# Patient Record
Sex: Female | Born: 1945 | Race: White | Hispanic: No | Marital: Single | Smoking: Never smoker
Health system: Southern US, Community
[De-identification: ages and names within clinical notes are randomized; demographics above are authoritative.]

## PROBLEM LIST (undated history)

## (undated) DIAGNOSIS — IMO0002 Reserved for concepts with insufficient information to code with codable children: Secondary | ICD-10-CM

## (undated) DIAGNOSIS — F32A Depression, unspecified: Secondary | ICD-10-CM

## (undated) DIAGNOSIS — N816 Rectocele: Secondary | ICD-10-CM

## (undated) DIAGNOSIS — F329 Major depressive disorder, single episode, unspecified: Secondary | ICD-10-CM

## (undated) DIAGNOSIS — I10 Essential (primary) hypertension: Secondary | ICD-10-CM

## (undated) DIAGNOSIS — E079 Disorder of thyroid, unspecified: Secondary | ICD-10-CM

## (undated) DIAGNOSIS — F0390 Unspecified dementia without behavioral disturbance: Secondary | ICD-10-CM

## (undated) HISTORY — PX: BLADDER SUSPENSION: SHX72

## (undated) HISTORY — PX: TONSILLECTOMY: SUR1361

## (undated) HISTORY — PX: OTHER SURGICAL HISTORY: SHX169

## (undated) HISTORY — DX: Reserved for concepts with insufficient information to code with codable children: IMO0002

## (undated) HISTORY — DX: Rectocele: N81.6

## (undated) HISTORY — PX: HEMORRHOID SURGERY: SHX153

## (undated) HISTORY — PX: KNEE SURGERY: SHX244

---

## 1990-01-06 HISTORY — PX: OOPHORECTOMY: SHX86

## 1990-01-06 HISTORY — PX: VAGINAL HYSTERECTOMY: SUR661

## 1997-04-11 ENCOUNTER — Other Ambulatory Visit: Admission: RE | Admit: 1997-04-11 | Discharge: 1997-04-11 | Payer: Self-pay | Admitting: Obstetrics and Gynecology

## 2000-01-28 ENCOUNTER — Encounter: Payer: Self-pay | Admitting: General Surgery

## 2000-01-28 ENCOUNTER — Encounter: Admission: RE | Admit: 2000-01-28 | Discharge: 2000-01-28 | Payer: Self-pay | Admitting: General Surgery

## 2000-02-13 ENCOUNTER — Encounter: Admission: RE | Admit: 2000-02-13 | Discharge: 2000-02-13 | Payer: Self-pay | Admitting: General Surgery

## 2000-02-13 ENCOUNTER — Encounter: Payer: Self-pay | Admitting: General Surgery

## 2001-07-01 ENCOUNTER — Other Ambulatory Visit: Admission: RE | Admit: 2001-07-01 | Discharge: 2001-07-01 | Payer: Self-pay | Admitting: Obstetrics and Gynecology

## 2002-01-28 ENCOUNTER — Encounter: Payer: Self-pay | Admitting: Obstetrics and Gynecology

## 2002-02-07 ENCOUNTER — Inpatient Hospital Stay (HOSPITAL_COMMUNITY): Admission: RE | Admit: 2002-02-07 | Discharge: 2002-02-10 | Payer: Self-pay | Admitting: Obstetrics and Gynecology

## 2002-09-08 ENCOUNTER — Other Ambulatory Visit: Admission: RE | Admit: 2002-09-08 | Discharge: 2002-09-08 | Payer: Self-pay | Admitting: Obstetrics and Gynecology

## 2003-09-14 ENCOUNTER — Other Ambulatory Visit: Admission: RE | Admit: 2003-09-14 | Discharge: 2003-09-14 | Payer: Self-pay | Admitting: Obstetrics and Gynecology

## 2004-09-17 ENCOUNTER — Other Ambulatory Visit: Admission: RE | Admit: 2004-09-17 | Discharge: 2004-09-17 | Payer: Self-pay | Admitting: Obstetrics and Gynecology

## 2005-09-23 ENCOUNTER — Other Ambulatory Visit: Admission: RE | Admit: 2005-09-23 | Discharge: 2005-09-23 | Payer: Self-pay | Admitting: Obstetrics and Gynecology

## 2006-07-07 ENCOUNTER — Encounter: Admission: RE | Admit: 2006-07-07 | Discharge: 2006-07-07 | Payer: Self-pay | Admitting: Geriatric Medicine

## 2006-10-23 ENCOUNTER — Other Ambulatory Visit: Admission: RE | Admit: 2006-10-23 | Discharge: 2006-10-23 | Payer: Self-pay | Admitting: Obstetrics and Gynecology

## 2007-11-15 ENCOUNTER — Emergency Department (HOSPITAL_COMMUNITY): Admission: EM | Admit: 2007-11-15 | Discharge: 2007-11-16 | Payer: Self-pay | Admitting: Emergency Medicine

## 2007-11-16 ENCOUNTER — Other Ambulatory Visit: Admission: RE | Admit: 2007-11-16 | Discharge: 2007-11-16 | Payer: Self-pay | Admitting: Obstetrics and Gynecology

## 2007-11-16 ENCOUNTER — Encounter: Payer: Self-pay | Admitting: Obstetrics and Gynecology

## 2007-11-16 ENCOUNTER — Ambulatory Visit: Payer: Self-pay | Admitting: Obstetrics and Gynecology

## 2008-12-19 ENCOUNTER — Other Ambulatory Visit: Admission: RE | Admit: 2008-12-19 | Discharge: 2008-12-19 | Payer: Self-pay | Admitting: Obstetrics and Gynecology

## 2008-12-19 ENCOUNTER — Ambulatory Visit: Payer: Self-pay | Admitting: Obstetrics and Gynecology

## 2009-04-09 ENCOUNTER — Ambulatory Visit: Payer: Self-pay | Admitting: Cardiology

## 2009-04-18 ENCOUNTER — Emergency Department (HOSPITAL_COMMUNITY): Admission: EM | Admit: 2009-04-18 | Discharge: 2009-04-19 | Payer: Self-pay | Admitting: Emergency Medicine

## 2010-03-27 LAB — BASIC METABOLIC PANEL
BUN: 8 mg/dL (ref 6–23)
CO2: 28 mEq/L (ref 19–32)
Calcium: 9 mg/dL (ref 8.4–10.5)
Chloride: 100 mEq/L (ref 96–112)
Creatinine, Ser: 0.69 mg/dL (ref 0.4–1.2)
GFR calc Af Amer: >60 mL/min (ref >60)
GFR calc non Af Amer: >60 mL/min (ref >60)
Glucose, Bld: 97 mg/dL (ref 70–99)
Potassium: 3.4 mEq/L — ABNORMAL LOW (ref 3.5–5.1)
Sodium: 136 mEq/L (ref 135–145)

## 2010-03-27 LAB — DIFFERENTIAL
Eosinophils Relative: 2 % (ref 0–5)
Lymphocytes Relative: 16 % (ref 12–46)
Lymphs Abs: 1.1 10*3/uL (ref 0.7–4.0)
Monocytes Relative: 7 % (ref 3–12)

## 2010-03-27 LAB — CK TOTAL AND CKMB (NOT AT ARMC)
CK, MB: 1 ng/mL (ref 0.3–4.0)
Relative Index: INVALID (ref 0.0–2.5)

## 2010-03-27 LAB — TROPONIN I: Troponin I: 0.01 ng/mL (ref 0.00–0.06)

## 2010-03-27 LAB — APTT: aPTT: 27 seconds (ref 24–37)

## 2010-03-27 LAB — CBC
HCT: 38.4 % (ref 36.0–46.0)
MCV: 92.3 fL (ref 78.0–100.0)
Platelets: 190 10*3/uL (ref 150–400)
RBC: 4.16 MIL/uL (ref 3.87–5.11)
WBC: 6.8 10*3/uL (ref 4.0–10.5)

## 2010-03-27 LAB — POCT CARDIAC MARKERS
CKMB, poc: <1 ng/mL — ABNORMAL LOW (ref 1.0–8.0)
Myoglobin, poc: 63.7 ng/mL (ref 12–200)
Troponin i, poc: <0.05 ng/mL (ref 0.00–0.09)
Troponin i, poc: <0.05 ng/mL (ref 0.00–0.09)
Troponin i, poc: <0.05 ng/mL (ref 0.00–0.09)

## 2010-03-27 LAB — D-DIMER, QUANTITATIVE: D-Dimer, Quant: <0.22 ug/mL-FEU (ref 0.00–0.48)

## 2010-03-27 LAB — PROTIME-INR: Prothrombin Time: 12.7 seconds (ref 11.6–15.2)

## 2010-05-24 NOTE — Discharge Summary (Signed)
   NAME:  Frances Frances [name], Frances Frances [name]                       ACCOUNT NO.:  [id number]   MEDICAL RECORD NO.:  [id number]                   PATIENT TYPE:  INP   LOCATION:  0456                                 FACILITY:  [facility]   PHYSICIAN:  Daniel L. Eda Paschal, M.D.           DATE OF BIRTH:  13-Feb-1945   DATE OF ADMISSION:  02/07/2002  DATE OF DISCHARGE:  02/10/2002                                 DISCHARGE SUMMARY   HISTORY OF PRESENT ILLNESS:  The patient is Frances [name] 65 year old female who was  admitted to the hospital with cystocele, rectocele, and urinary stress  incontinence, for definitive surgery.  On the day of admission, she was  taken to the operating room.  She underwent an anterior and posterior repair  by Dr. Eda Paschal, and Frances [name] sling procedure by Dr. Vonita Moss.  Postoperatively when her Foley was removed, she was unable to void.  Her  Foley was reinserted.  She was then instructed on self-cath, and by the  third postoperative day she was able to do self-cath, and was voiding well  enough to be discharged.   DISCHARGE MEDICATIONS:  1. Levaquin for antibiotic prevention.   FOLLOW UP:  She will be followed in both Dr. Enos Fling and Dr. Verl Dicker  office.   DIET ON DISCHARGE:  Regular.   ACTIVITY ON DISCHARGE:  Ambulatory.   CONDITION ON DISCHARGE:  Improved.   DISCHARGE DIAGNOSES:  1. Cystocele.  2. Rectocele.  3. Urinary stress incontinence.  4. Postoperative urinary retention.   OPERATIONS:  1. Anterior and posterior repair.  2. Spark procedure.                                               Daniel L. Eda Paschal, M.D.    [name]  D:  03/01/2002  T:  03/01/2002  Job:  [id number]

## 2010-05-24 NOTE — H&P (Signed)
NAME:  Frances Frances [name], Frances Frances [name]                       ACCOUNT NO.:  [id number]   MEDICAL RECORD NO.:  [id number]                   PATIENT TYPE:  INP   LOCATION:  NA                                   FACILITY:  [facility]   PHYSICIAN:  Daniel L. Eda Paschal, M.D.           DATE OF BIRTH:  [birth year]   DATE OF ADMISSION:  DATE OF DISCHARGE:                                HISTORY & PHYSICAL   CHIEF COMPLAINT:  Symptomatic cystocele and rectocele.   HISTORY OF PRESENT ILLNESS:  The patient is Frances [name] 65 year old nulligravida who  came to see me in December with Frances [name] history of having felt something fall out  of her vagina.  She now has Frances [name] persistent bulge which is very annoying to  her.  She is also finding that as stool gets to her rectum she has to use  perineal pressure in order to remove it.  She is status post an LAVH/BSO  that was done previously because of endometriosis.  On examination she has Frances [name]  significant cystocele and small rectocele to account for the above symptoms.  She was sent to Dr. Larey Dresser by me for evaluation because she also has  Frances [name] history of urinary stress incontinence.  After consultation between the  two of [place], we have elected to proceed with an anterior and posterior repair  to get rid of her cystocele/rectocele, and she will also undergo Frances [name] sling  procedure at the same setting because of the incontinence.   PAST MEDICAL HISTORY:  Previous surgery includes LAVH/BSO for endometriosis,  knee surgery, hemorrhoidectomy.  The patient is Frances [name] type 2 diabetic.  She is  also hypertensive and hypothyroid.   PRESENT MEDICATIONS:  1. Lipitor 10 mg daily.  2. Premarin 0.625 mg daily.  3. Monopril 20/12.5.  4. Glucophage XR 500 mg daily.  5. Synthroid.   ALLERGIES:  DEMEROL, SHELLFISH, and FLAGYL.   SOCIAL HISTORY:  Completely noncontributory.   FAMILY HISTORY:  Sister is diabetic.  Family history of hypertension.   REVIEW OF SYSTEMS:  HEENT:  Negative.  CARDIAC:  Reveals  hypertension.  RESPIRATORY:  Negative.  GI:  Reveals GERD and irritable bowel syndrome.  MUSCULOSKELETAL:  Negative.  GU:  See above.  NEUROLOGIC/PSYCHIATRIC:  Negative.  ENDOCRINE:  Reveals hypothyroidism.   PHYSICAL EXAMINATION:  GENERAL:  The patient is Frances [name] well-developed and well-  nourished female in no acute distress.  VITAL SIGNS:  Blood pressure 130/76, pulse 80 and regular, respirations 16  and nonlabored.  She is afebrile.  HEENT:  Within normal limits.  NECK:  Supple, trachea in the midline.  Thyroid is not enlarged.  LUNGS:  Clear to P&Frances [name].  HEART:  No thrills, heaves, or murmurs.  BREASTS:  No masses.  ABDOMEN:  Soft without guarding, rebound, or masses.  PELVIC:  External is normal.  BUS is normal.  Bladder shows Frances [name] third degree  cystocele.  Vagina is normal without enterocele.  There  is no real obvious  rectocele.  However, the patient does have rectocele-type symptoms.  Bimanual is negative and rectal is negative.   ADMISSION IMPRESSION:  Cystocele/rectocele, urinary incontinence.   PLAN:  See above.                                               Daniel L. Eda Paschal, M.D.    [name]  D:  02/04/2002  T:  02/04/2002  Job:  [id number]

## 2010-05-24 NOTE — Op Note (Signed)
NAME:  Frances [name], Frances [name] A                       ACCOUNT NO.:  [id number]   MEDICAL RECORD NO.:  [id number]                   PATIENT TYPE:  INP   LOCATION:  0456                                 FACILITY:  [facility]   PHYSICIAN:  Daniel L. Eda Paschal, M.D.           DATE OF BIRTH:  [birth year]   DATE OF PROCEDURE:  02/07/2002  DATE OF DISCHARGE:                                 OPERATIVE REPORT   PREOPERATIVE DIAGNOSES:  Cystocele, rectocele.   POSTOPERATIVE DIAGNOSES:  Cystocele, rectocele.   OPERATION:  Anterior and posterior repair.   SURGEON:  Daniel L. Eda Paschal, M.D.   FIRST ASSISTANT:  Ivor Costa. Farrel Gobble, M.D.   ANESTHESIA:  General endotracheal.   FINDINGS:  At the beginning of the procedure, the patient had a third degree  cystocele with complete loss of the urethrovesical angle. The patient had a  1 1/2 degree rectocele, the vault was well supported, there was no  enterocele present.   DESCRIPTION OF PROCEDURE:  After adequate general endotracheal anesthesia,  the patient was placed in the dorsal lithotomy position, prepped and draped  in the usual sterile fashion. Allis clamps were placed in the angles of the  top of the vagina at the top of the cuff and an inverted T incision was made  to the top of the urethra. The perivesical fascia was sharply and bluntly  dissected free so that the cystocele was completely dissected free from the  vaginal mucosa. At this point, Dr. Larey Dresser scrubbed in and did a  sling procedure which is dictated in a separate operative note. After he  finished the sling, I scrubbed back in and the cystocele which had already  been dissected free was reduced. The patient had excellent vesical fascia  and this was brought together in the midline with interrupted's of 2-0  Vicryl to completely eliminate the cystocele. Approximately 10 such sutures  were utilized. The redundant vaginal mucosa was then trimmed away. Following  this, the anterior  vaginal mucosa was then closed with a 2-0 Vicryl. The  vesical fascia was also picked up with the second layer closing the mucosa  in order to eliminate any dead space. Once this was accomplished, attention  was next turned to the rectocele. The patient had an excellent perineum and  only needed the rectocele reduced. Therefore the Allis clamps were placed at  the mucocutaneous junction. An incision was made across and then carried up  to the top of the vaginal cuff. The perirectal fascia was sharply dissected  free and the rectocele was identified. The surgeon put one finger in the  rectum to help with the dissection and also to prevent any injury to the  rectum which did not occur. Once this had been done, the redundant vaginal  mucosa was trimmed away and the rectocele was reduced with interrupted's of  2-0 Vicryl picking up perirectal fascia. The vaginal mucosa was then closed  with a  running 2-0 Vicryl also picking up the perirectal fascia underneath  in order to eliminate dead space. At the termination of the procedure, the  patient had excellent anterior  and posterior walls of the vagina, the Foley catheter which had been  inserted by Dr. Vonita Moss was draining clear urine. The vagina was packed  with 1 inch gauze. The patient tolerated the procedure well and left the  operating room in satisfactory condition. Estimated blood loss for the  entire procedure was 250 cc.                                               Daniel L. Eda Paschal, M.D.    [name]  D:  02/07/2002  T:  02/07/2002  Job:  [id number]

## 2010-05-24 NOTE — Op Note (Signed)
NAME:  Frances [name], Frances [name]                       ACCOUNT NO.:  [id number]   MEDICAL RECORD NO.:  [id number]                   PATIENT TYPE:  INP   LOCATION:  X007                                 FACILITY:  [facility]   PHYSICIAN:  Maretta Bees. Vonita Moss, M.D.             DATE OF BIRTH:  [birth year]   DATE OF PROCEDURE:  02/07/2002  DATE OF DISCHARGE:                                 OPERATIVE REPORT   PREOPERATIVE DIAGNOSIS:  Stress urinary incontinence and cystocele.   POSTOPERATIVE DIAGNOSIS:  Stress urinary incontinence and cystocele.   PROCEDURE:  SPARC sling system insertion.   SURGEON:  Maretta Bees. Vonita Moss, M.D.   ANESTHESIA:  General.   INDICATIONS:  This 65 year old lady has had significant stress incontinence.  She has had a previous hysterectomy.  She has a large cystocele that needs  repair by Dr. Eda Paschal.  She was counseled about the risks of infection,  erosion, rejection, bladder injury, or urinary retention.   DESCRIPTION OF PROCEDURE:  After Dr. Eda Paschal made his initial vaginal  incision, he scrubbed out and I scrubbed in, and a Foley catheter was placed  int he bladder and drained 600 mL of urine.  I was able to then bluntly  dissect up toward each side of the urethra to the endopelvic fascia.  Small  incisions were made with a blade on each side of the midline suprapubically,  and a SPARC needle was placed through this stab wound and brought down on  top of the symphysis pubis and marched back down the back side of the  symphysis pubis lateral to the urethra on each side.  The needle then  perforated the endopelvic fascia on each side and was brought out through a  vaginal incision.  At this point I cystoscoped her and there was no evidence  of the needle injuring the bladder or being visible, and there were no folds  in the bladder to obscure visualization.  I then snapped on the sling  material to each needle, and the sling was pulled up into the suprapubic  stab  wounds bilaterally.  The plastic coverings were removed from the  fascial sling, which was then positioned in midurethra with a hemostat  between the sling material and the urethra so it would not be too tight and  yet tight enough to control stress incontinence.  The sling material was cut  off just below skin level in each of our suprapubic wounds, and each  suprapubic wound was irrigated with antibiotic solution.  I should add that  after I had pulled up the sling material and before I put it in position, I  did cystoscope her again and saw no sling material in the bladder.  At this  point the sling was in midurethra with just space enough for a  hemostat to slip between it and the urethra, and I irrigated the wound again  with antibiotic solution and scrubbed  out while Dr. Eda Paschal completed his  cystocele repair.  The Foley catheter was left indwelling and will be  removed in the morning.                                               Maretta Bees. Vonita Moss, M.D.    LJP/MEDQ  D:  02/07/2002  T:  02/07/2002  Job:  [id number]   cc:   Reuel Boom L. Eda Paschal, M.D.  [address], [address]  [place]  [place] [address]  Fax: [phone]

## 2010-07-26 ENCOUNTER — Other Ambulatory Visit: Payer: Self-pay | Admitting: Geriatric Medicine

## 2010-07-26 DIAGNOSIS — R413 Other amnesia: Secondary | ICD-10-CM

## 2010-07-31 ENCOUNTER — Ambulatory Visit
Admission: RE | Admit: 2010-07-31 | Discharge: 2010-07-31 | Disposition: A | Discharge status: Home / Self Care | Payer: BC Managed Care – PPO | Source: Ambulatory Visit | Attending: Geriatric Medicine | Admitting: Geriatric Medicine

## 2010-07-31 DIAGNOSIS — R413 Other amnesia: Secondary | ICD-10-CM

## 2010-07-31 MED ORDER — GADOBENATE DIMEGLUMINE 529 MG/ML IV SOLN
13.0000 mL | Freq: Once | INTRAVENOUS | Status: AC | PRN
  Administered 2010-07-31: 13 mL via INTRAVENOUS

## 2010-10-14 DIAGNOSIS — R413 Other amnesia: Secondary | ICD-10-CM

## 2010-10-14 DIAGNOSIS — F329 Major depressive disorder, single episode, unspecified: Secondary | ICD-10-CM

## 2011-03-18 DIAGNOSIS — F0280 Dementia in other diseases classified elsewhere without behavioral disturbance: Secondary | ICD-10-CM | POA: Diagnosis not present

## 2011-03-18 DIAGNOSIS — R413 Other amnesia: Secondary | ICD-10-CM | POA: Diagnosis not present

## 2011-03-18 DIAGNOSIS — G309 Alzheimer's disease, unspecified: Secondary | ICD-10-CM | POA: Diagnosis not present

## 2011-03-24 ENCOUNTER — Encounter: Payer: Self-pay | Admitting: Obstetrics and Gynecology

## 2011-03-28 DIAGNOSIS — F0280 Dementia in other diseases classified elsewhere without behavioral disturbance: Secondary | ICD-10-CM | POA: Diagnosis not present

## 2011-03-28 DIAGNOSIS — G309 Alzheimer's disease, unspecified: Secondary | ICD-10-CM | POA: Diagnosis not present

## 2011-03-28 DIAGNOSIS — E119 Type 2 diabetes mellitus without complications: Secondary | ICD-10-CM | POA: Diagnosis not present

## 2011-03-28 DIAGNOSIS — I10 Essential (primary) hypertension: Secondary | ICD-10-CM | POA: Diagnosis not present

## 2011-04-07 ENCOUNTER — Emergency Department (HOSPITAL_COMMUNITY): Payer: Medicare Other

## 2011-04-07 ENCOUNTER — Emergency Department (HOSPITAL_COMMUNITY)
Admission: EM | Admit: 2011-04-07 | Discharge: 2011-04-07 | Disposition: A | Discharge status: Home / Self Care | Payer: Medicare Other | Attending: Emergency Medicine | Admitting: Emergency Medicine

## 2011-04-07 ENCOUNTER — Encounter (HOSPITAL_COMMUNITY): Payer: Self-pay | Admitting: Emergency Medicine

## 2011-04-07 DIAGNOSIS — F329 Major depressive disorder, single episode, unspecified: Secondary | ICD-10-CM | POA: Insufficient documentation

## 2011-04-07 DIAGNOSIS — M79609 Pain in unspecified limb: Secondary | ICD-10-CM | POA: Insufficient documentation

## 2011-04-07 DIAGNOSIS — F0390 Unspecified dementia without behavioral disturbance: Secondary | ICD-10-CM | POA: Diagnosis not present

## 2011-04-07 DIAGNOSIS — I10 Essential (primary) hypertension: Secondary | ICD-10-CM | POA: Diagnosis not present

## 2011-04-07 DIAGNOSIS — M79672 Pain in left foot: Secondary | ICD-10-CM

## 2011-04-07 DIAGNOSIS — F3289 Other specified depressive episodes: Secondary | ICD-10-CM | POA: Insufficient documentation

## 2011-04-07 DIAGNOSIS — M25579 Pain in unspecified ankle and joints of unspecified foot: Secondary | ICD-10-CM | POA: Insufficient documentation

## 2011-04-07 DIAGNOSIS — Z7982 Long term (current) use of aspirin: Secondary | ICD-10-CM | POA: Diagnosis not present

## 2011-04-07 DIAGNOSIS — Z79899 Other long term (current) drug therapy: Secondary | ICD-10-CM | POA: Diagnosis not present

## 2011-04-07 DIAGNOSIS — E119 Type 2 diabetes mellitus without complications: Secondary | ICD-10-CM | POA: Diagnosis not present

## 2011-04-07 DIAGNOSIS — E079 Disorder of thyroid, unspecified: Secondary | ICD-10-CM | POA: Insufficient documentation

## 2011-04-07 DIAGNOSIS — M2010 Hallux valgus (acquired), unspecified foot: Secondary | ICD-10-CM | POA: Diagnosis not present

## 2011-04-07 DIAGNOSIS — S8990XA Unspecified injury of unspecified lower leg, initial encounter: Secondary | ICD-10-CM | POA: Insufficient documentation

## 2011-04-07 DIAGNOSIS — M7989 Other specified soft tissue disorders: Secondary | ICD-10-CM | POA: Diagnosis not present

## 2011-04-07 DIAGNOSIS — M25879 Other specified joint disorders, unspecified ankle and foot: Secondary | ICD-10-CM | POA: Diagnosis not present

## 2011-04-07 DIAGNOSIS — X500XXA Overexertion from strenuous movement or load, initial encounter: Secondary | ICD-10-CM | POA: Insufficient documentation

## 2011-04-07 HISTORY — DX: Depression, unspecified: F32.A

## 2011-04-07 HISTORY — DX: Disorder of thyroid, unspecified: E07.9

## 2011-04-07 HISTORY — DX: Unspecified dementia, unspecified severity, without behavioral disturbance, psychotic disturbance, mood disturbance, and anxiety: F03.90

## 2011-04-07 HISTORY — DX: Essential (primary) hypertension: I10

## 2011-04-07 HISTORY — DX: Major depressive disorder, single episode, unspecified: F32.9

## 2011-04-07 MED ORDER — HYDROCODONE-ACETAMINOPHEN 5-325 MG PO TABS: 1.0000 | ORAL_TABLET | ORAL | Status: AC | PRN

## 2011-04-07 NOTE — ED Provider Notes (Signed)
History     CSN: [id number]  Arrival date & time 04/07/11  1191   First MD Initiated Contact with Patient 04/07/11 [phone]      Chief Complaint  Patient presents with  . Toe Injury    l/great toe- (first toe) swollenn due to tripping on stairs two days ago. 2nd toe slightly swollen    (Consider location/radiation/quality/duration/timing/severity/associated sxs/prior treatment) HPI History from patient. 66 year old female past medical history of hypertension, diabetes presents with left great toe pain. She states that on Saturday, she was ascending some stairs when she tripped, twisting her left foot. Did not hit her head, no LOC. She has had worsening pain to the left great toe since that time. Pain is throbbing in nature and worsens with wt bearing. Has noticed increased swelling to the area. Has tried ice which was minimally helpful. No other treatments prior to arrival.  Past Medical History  Diagnosis Date  . Hypertension   . Diabetes mellitus   . Thyroid disease   . Dementia   . Depressed     Past Surgical History  Procedure Date  . Abdominal hysterectomy   . Tonsillectomy     Family History  Problem Relation Age of Onset  . Hypertension Mother   . Stroke Mother   . Heart failure Mother     History  Substance Use Topics  . Smoking status: Never Smoker   . Smokeless tobacco: Not on file  . Alcohol Use: No    OB History    Grav Para Term Preterm Abortions TAB SAB Ect Mult Living                  Review of Systems  Constitutional: Negative.   Musculoskeletal: Positive for arthralgias and gait problem.  Skin: Negative.   Neurological: Negative for weakness and numbness.    Allergies  Review of patient's allergies indicates no known allergies.  Home Medications   Current Outpatient Rx  Name Route Sig Dispense Refill  . ASPIRIN 81 MG PO TABS Oral Take 81 mg by mouth daily.    . ATORVASTATIN CALCIUM 20 MG PO TABS Oral Take 20 mg by mouth daily.    Marland Kitchen  CETIRIZINE HCL 10 MG PO TABS Oral Take 10 mg by mouth daily.    . DONEPEZIL HCL 10 MG PO TABS Oral Take 23 mg by mouth at bedtime as needed.    Marland Kitchen ESCITALOPRAM OXALATE 10 MG PO TABS Oral Take 10 mg by mouth daily.    . FOSINOPRIL SODIUM 40 MG PO TABS Oral Take 40 mg by mouth daily.    . FUROSEMIDE 20 MG PO TABS Oral Take 20 mg by mouth 2 (two) times daily.    Marland Kitchen LEVOTHYROXINE SODIUM 75 MCG PO TABS Oral Take 75 mcg by mouth daily.    Marland Kitchen METFORMIN HCL 500 MG PO TABS Oral Take 500 mg by mouth 1 day or 1 dose.      BP 130/68  Pulse 64  Temp 97.4 F (36.3 C)  SpO2 100%  Physical Exam  Nursing note and vitals reviewed. Constitutional: She appears well-developed and well-nourished. No distress.  HENT:  Head: Normocephalic.  Cardiovascular: Normal rate.   Pulmonary/Chest: Effort normal.  Musculoskeletal:       L foot: No erythema, edema, crepitus noted. Tender to palpation at MTP of great toe. Slight hallux valgus noted. Full range of motion of toes, although dorsiflexion causes pain. Foot is neurovascularly intact with sensory intact to light touch. Capillary refill  less than 3 seconds. DP/PT pulses intact.   Neurological: She is alert.  Skin: Skin is warm and dry. She is not diaphoretic.  Psychiatric: She has a normal mood and affect.    ED Course  Procedures (including critical care time)  Labs Reviewed - No data to display Dg Foot Complete Left  04/07/2011  *RADIOLOGY REPORT*  Clinical Data: Tripped stepping off a curb twisting foot, fall, great toe pain  LEFT FOOT - COMPLETE 3+ VIEW  Comparison: None  Findings: Osseous demineralization. Mild degenerative changes first MTP joint with joint space narrowing and spur formation. Mild hallux valgus. Soft tissue swelling medial aspect of great toe and first MTP joint. Remaining joint spaces preserved. No definite acute fracture, dislocation or bone destruction.  IMPRESSION: Mild hallux valgus and degenerative changes first MTP joint. No acute  osseous abnormalities.  Original Report Authenticated By: Lollie Marrow, M.D.     1. Left foot pain       MDM  This 66 year old female presents with pain to her left foot. X-rays, which I personally reviewed and reviewed with the patient are negative for acute fracture or bony abnormality. They do, however, show degenerative changes around the first MTP and mild hallux valgus. Will place in post op shoe and on crutches, NWB. Instructed to f/u with Dr. Pete Glatter later this week. Return precautions discussed.        Grant Fontana, [place] 04/07/11 1250

## 2011-04-07 NOTE — ED Provider Notes (Signed)
Medical screening examination/treatment/procedure(s) were conducted as a shared visit with non-physician practitioner(s) and myself.  I personally evaluated the patient during the encounter Pt with injury to the foot without other injury.  Plain films wnl.  Pt placed in post-op shoe and given f/u.  Gwyneth Sprout, MD 04/07/11 2147

## 2011-04-07 NOTE — Discharge Instructions (Signed)
Your xray did not show signs of a broken bone. Please plan to follow up with Dr. Pete Glatter in 2 days for a recheck. In the meantime, stay non weight bearing on that foot for at least the next 2 days. Use the post op shoe for comfort. Continue to ice and prop up the foot on pillows. You may use Tylenol as needed for pain. Return to the ER for worsening pain, numbness, weakness, or other worrisome symptoms.

## 2011-05-13 DIAGNOSIS — E7800 Pure hypercholesterolemia, unspecified: Secondary | ICD-10-CM | POA: Diagnosis not present

## 2011-05-13 DIAGNOSIS — Z79899 Other long term (current) drug therapy: Secondary | ICD-10-CM | POA: Diagnosis not present

## 2011-06-11 ENCOUNTER — Encounter: Payer: Self-pay | Admitting: Gynecology

## 2011-06-11 DIAGNOSIS — R32 Unspecified urinary incontinence: Secondary | ICD-10-CM | POA: Insufficient documentation

## 2011-06-11 DIAGNOSIS — N816 Rectocele: Secondary | ICD-10-CM | POA: Insufficient documentation

## 2011-06-11 DIAGNOSIS — E119 Type 2 diabetes mellitus without complications: Secondary | ICD-10-CM | POA: Insufficient documentation

## 2011-06-11 DIAGNOSIS — F0390 Unspecified dementia without behavioral disturbance: Secondary | ICD-10-CM | POA: Insufficient documentation

## 2011-06-11 DIAGNOSIS — N809 Endometriosis, unspecified: Secondary | ICD-10-CM | POA: Insufficient documentation

## 2011-06-11 DIAGNOSIS — IMO0002 Reserved for concepts with insufficient information to code with codable children: Secondary | ICD-10-CM | POA: Insufficient documentation

## 2011-06-11 DIAGNOSIS — E039 Hypothyroidism, unspecified: Secondary | ICD-10-CM | POA: Insufficient documentation

## 2011-06-23 ENCOUNTER — Ambulatory Visit (INDEPENDENT_AMBULATORY_CARE_PROVIDER_SITE_OTHER): Payer: Medicare Other | Admitting: Obstetrics and Gynecology

## 2011-06-23 ENCOUNTER — Encounter: Payer: Self-pay | Admitting: Obstetrics and Gynecology

## 2011-06-23 VITALS — BP 136/78 | Ht 67.0 in | Wt 158.0 lb

## 2011-06-23 DIAGNOSIS — N952 Postmenopausal atrophic vaginitis: Secondary | ICD-10-CM

## 2011-06-23 DIAGNOSIS — N816 Rectocele: Secondary | ICD-10-CM | POA: Diagnosis not present

## 2011-06-23 DIAGNOSIS — Z780 Asymptomatic menopausal state: Secondary | ICD-10-CM | POA: Diagnosis not present

## 2011-06-23 NOTE — Progress Notes (Signed)
Patient came back to see me today for further followup. We have had her on estrogen replacement therapy for a long period of time. Last year we were able to taper her and so far her symptoms are mild enough to not require reinitiation of treatment. She does have vaginal dryness but is not sexually active and is unaware of it. Her urinary stress incontinence is much relieved since her sling procedure. She does have a small rectocele but has no trouble getting stool out. She is having no dysuria, urinary urgency, or hematuria. She is up-to-date on mammograms. She a normal bone density through our office. She is having no vaginal bleeding. She is having no pelvic pain.  HEENT: Within normal limits. Blanca present Neck: No masses. Supraclavicular lymph nodes: Not enlarged. Breasts: Examined in both sitting and lying position. Symmetrical without skin changes or masses. Abdomen: Soft no masses guarding or rebound. No hernias. Pelvic: External within normal limits. BUS within normal limits. Vaginal examination shows poor estrogen effect, first-degree rectocele.. Cervix and uterus absent. Adnexa within normal limits. Rectovaginal confirmatory. Extremities within normal limits.  Assessment: #1. Menopausal symptoms significantly improved #2. Small rectocele-asymptomatic #3. Vaginal atrophy.  Plan: Discussed all of the above in detail with the patient. For the moment no treatment necessary. A followup mammogram in one year.

## 2011-06-30 DIAGNOSIS — E119 Type 2 diabetes mellitus without complications: Secondary | ICD-10-CM | POA: Diagnosis not present

## 2011-06-30 DIAGNOSIS — I10 Essential (primary) hypertension: Secondary | ICD-10-CM | POA: Diagnosis not present

## 2011-06-30 DIAGNOSIS — Z79899 Other long term (current) drug therapy: Secondary | ICD-10-CM | POA: Diagnosis not present

## 2011-07-21 DIAGNOSIS — F0280 Dementia in other diseases classified elsewhere without behavioral disturbance: Secondary | ICD-10-CM | POA: Diagnosis not present

## 2011-07-21 DIAGNOSIS — F329 Major depressive disorder, single episode, unspecified: Secondary | ICD-10-CM | POA: Diagnosis not present

## 2011-10-21 DIAGNOSIS — L820 Inflamed seborrheic keratosis: Secondary | ICD-10-CM | POA: Diagnosis not present

## 2011-10-21 DIAGNOSIS — L570 Actinic keratosis: Secondary | ICD-10-CM | POA: Diagnosis not present

## 2011-10-21 DIAGNOSIS — Z85828 Personal history of other malignant neoplasm of skin: Secondary | ICD-10-CM | POA: Diagnosis not present

## 2011-10-21 DIAGNOSIS — D239 Other benign neoplasm of skin, unspecified: Secondary | ICD-10-CM | POA: Diagnosis not present

## 2011-10-28 DIAGNOSIS — E119 Type 2 diabetes mellitus without complications: Secondary | ICD-10-CM | POA: Diagnosis not present

## 2011-10-28 DIAGNOSIS — F0280 Dementia in other diseases classified elsewhere without behavioral disturbance: Secondary | ICD-10-CM | POA: Diagnosis not present

## 2011-10-28 DIAGNOSIS — Z23 Encounter for immunization: Secondary | ICD-10-CM | POA: Diagnosis not present

## 2011-10-28 DIAGNOSIS — I10 Essential (primary) hypertension: Secondary | ICD-10-CM | POA: Diagnosis not present

## 2011-11-21 DIAGNOSIS — G309 Alzheimer's disease, unspecified: Secondary | ICD-10-CM | POA: Diagnosis not present

## 2011-12-16 DIAGNOSIS — G309 Alzheimer's disease, unspecified: Secondary | ICD-10-CM | POA: Diagnosis not present

## 2011-12-16 DIAGNOSIS — E119 Type 2 diabetes mellitus without complications: Secondary | ICD-10-CM | POA: Diagnosis not present

## 2011-12-16 DIAGNOSIS — I10 Essential (primary) hypertension: Secondary | ICD-10-CM | POA: Diagnosis not present

## 2011-12-16 DIAGNOSIS — Z79899 Other long term (current) drug therapy: Secondary | ICD-10-CM | POA: Diagnosis not present

## 2011-12-16 DIAGNOSIS — F0280 Dementia in other diseases classified elsewhere without behavioral disturbance: Secondary | ICD-10-CM | POA: Diagnosis not present

## 2012-01-13 DIAGNOSIS — Z85828 Personal history of other malignant neoplasm of skin: Secondary | ICD-10-CM | POA: Diagnosis not present

## 2012-01-13 DIAGNOSIS — L905 Scar conditions and fibrosis of skin: Secondary | ICD-10-CM | POA: Diagnosis not present

## 2012-01-28 ENCOUNTER — Other Ambulatory Visit: Payer: Self-pay | Admitting: Neurology

## 2012-01-28 DIAGNOSIS — R413 Other amnesia: Secondary | ICD-10-CM

## 2012-02-04 ENCOUNTER — Other Ambulatory Visit: Payer: Medicare Other

## 2012-02-10 ENCOUNTER — Ambulatory Visit
Admission: RE | Admit: 2012-02-10 | Discharge: 2012-02-10 | Disposition: A | Discharge status: Home / Self Care | Payer: No Typology Code available for payment source | Source: Ambulatory Visit | Attending: Neurology | Admitting: Neurology

## 2012-02-10 DIAGNOSIS — R413 Other amnesia: Secondary | ICD-10-CM

## 2012-02-26 ENCOUNTER — Other Ambulatory Visit (HOSPITAL_COMMUNITY): Payer: Self-pay | Admitting: Neurology

## 2012-02-26 DIAGNOSIS — R413 Other amnesia: Secondary | ICD-10-CM

## 2012-03-11 ENCOUNTER — Encounter (HOSPITAL_COMMUNITY)
Admission: RE | Admit: 2012-03-11 | Discharge: 2012-03-11 | Disposition: A | Discharge status: Home / Self Care | Payer: Self-pay | Source: Ambulatory Visit | Attending: Neurology | Admitting: Neurology

## 2012-03-11 DIAGNOSIS — R413 Other amnesia: Secondary | ICD-10-CM

## 2012-03-19 DIAGNOSIS — F0280 Dementia in other diseases classified elsewhere without behavioral disturbance: Secondary | ICD-10-CM | POA: Diagnosis not present

## 2012-03-19 DIAGNOSIS — G309 Alzheimer's disease, unspecified: Secondary | ICD-10-CM | POA: Diagnosis not present

## 2012-03-19 DIAGNOSIS — E119 Type 2 diabetes mellitus without complications: Secondary | ICD-10-CM | POA: Diagnosis not present

## 2012-04-02 ENCOUNTER — Ambulatory Visit (INDEPENDENT_AMBULATORY_CARE_PROVIDER_SITE_OTHER): Payer: Self-pay | Admitting: *Deleted

## 2012-04-02 DIAGNOSIS — F0280 Dementia in other diseases classified elsewhere without behavioral disturbance: Secondary | ICD-10-CM

## 2012-04-02 DIAGNOSIS — G309 Alzheimer's disease, unspecified: Secondary | ICD-10-CM

## 2012-04-02 NOTE — Progress Notes (Signed)
Patient was seen today for the visit 2 in the EXPEDITION 3 trial. Patient was accompanied by Rhona Wolfe as caregiver. Patient was started with several questionnaires while awaiting Dr. Sethi to perform neur and physical exam. During the questionnaire process, patient became very distraught and nervous. Stating that she did not like being asked some many questions about her condition. This concern was brought to Dr. Sethi's attention prior to his assessment. Dr. Sethi perform a physical and neuro exam on patient. Dr. Sethi discussed his concerns with patient regarding the completion of questionnaires and her emotional capabilities. Patient states that she did not want to participate in the battery of questionnaires for the trial. Dr. Sethi recommended that patient be a screen failure due to the inability to complete the questionnaires and to the decline in patient memory since last office visit.  Dr. Sethi stated that patient did not meet exclusion #31.Patient was given sample pack of Namenda by Dr. Sethi and will follow with primary neurologist in July as scheduled.  

## 2012-04-02 NOTE — Research (Signed)
Patient was seen today for the visit 2 in the EXPEDITION 3 trial. Patient was accompanied by Nicolette Bang as caregiver. Patient was started with several questionnaires while awaiting Dr. Pearlean Brownie to perform neur and physical exam. During the questionnaire process, patient became very distraught and nervous. Stating that she did not like being asked some many questions about her condition. This concern was brought to Dr. Marlis Edelson attention prior to his assessment. Dr. Pearlean Brownie perform a physical and neuro exam on patient. Dr. Pearlean Brownie discussed his concerns with patient regarding the completion of questionnaires and her emotional capabilities. Patient states that she did not want to participate in the battery of questionnaires for the trial. Dr. Pearlean Brownie recommended that patient be a screen failure due to the inability to complete the questionnaires and to the decline in patient memory since last office visit.  Dr. Pearlean Brownie stated that patient did not meet exclusion #31.Patient was given sample pack of Namenda by Dr. Pearlean Brownie and will follow with primary neurologist in July as scheduled.

## 2012-04-16 DIAGNOSIS — Z1231 Encounter for screening mammogram for malignant neoplasm of breast: Secondary | ICD-10-CM | POA: Diagnosis not present

## 2012-04-21 DIAGNOSIS — N63 Unspecified lump in unspecified breast: Secondary | ICD-10-CM | POA: Diagnosis not present

## 2012-05-10 ENCOUNTER — Other Ambulatory Visit: Payer: Self-pay

## 2012-05-10 MED ORDER — ESCITALOPRAM OXALATE 10 MG PO TABS: 10.0000 mg | ORAL_TABLET | Freq: Every day | ORAL | Status: DC

## 2012-05-10 NOTE — Telephone Encounter (Signed)
[facility] patient.  Approved refill Via [facility]

## 2012-05-10 NOTE — Telephone Encounter (Signed)
[facility] patient has not been assigned new provider.  Auth refills via [facility]

## 2012-06-22 DIAGNOSIS — E039 Hypothyroidism, unspecified: Secondary | ICD-10-CM | POA: Diagnosis not present

## 2012-06-22 DIAGNOSIS — Z1331 Encounter for screening for depression: Secondary | ICD-10-CM | POA: Diagnosis not present

## 2012-06-22 DIAGNOSIS — E782 Mixed hyperlipidemia: Secondary | ICD-10-CM | POA: Diagnosis not present

## 2012-06-22 DIAGNOSIS — E119 Type 2 diabetes mellitus without complications: Secondary | ICD-10-CM | POA: Diagnosis not present

## 2012-06-22 DIAGNOSIS — Z0000 Encounter for general adult medical examination without abnormal findings: Secondary | ICD-10-CM | POA: Diagnosis not present

## 2012-06-22 DIAGNOSIS — Z79899 Other long term (current) drug therapy: Secondary | ICD-10-CM | POA: Diagnosis not present

## 2012-07-07 DIAGNOSIS — Z79899 Other long term (current) drug therapy: Secondary | ICD-10-CM | POA: Diagnosis not present

## 2012-07-07 DIAGNOSIS — I10 Essential (primary) hypertension: Secondary | ICD-10-CM | POA: Diagnosis not present

## 2012-07-28 ENCOUNTER — Encounter: Payer: Self-pay | Admitting: Neurology

## 2012-08-02 ENCOUNTER — Telehealth: Payer: Self-pay | Admitting: Neurology

## 2012-08-05 ENCOUNTER — Encounter: Payer: Self-pay | Admitting: Neurology

## 2012-08-19 ENCOUNTER — Encounter: Payer: Self-pay | Admitting: Neurology

## 2012-08-19 ENCOUNTER — Ambulatory Visit (INDEPENDENT_AMBULATORY_CARE_PROVIDER_SITE_OTHER): Payer: Medicare Other | Admitting: Neurology

## 2012-08-19 VITALS — BP 140/76 | HR 77 | Ht 67.5 in | Wt 166.0 lb

## 2012-08-19 DIAGNOSIS — F0280 Dementia in other diseases classified elsewhere without behavioral disturbance: Secondary | ICD-10-CM

## 2012-08-19 DIAGNOSIS — G309 Alzheimer's disease, unspecified: Secondary | ICD-10-CM | POA: Diagnosis not present

## 2012-08-19 NOTE — Patient Instructions (Addendum)
I think overall you are doing fairly well but I do want to suggest a few things today:  Remember to drink plenty of fluid, eat healthy meals and do not skip any meals. Try to eat protein with a every meal and eat a healthy snack such as fruit or nuts in between meals. Try to keep a regular sleep-wake schedule and try to exercise daily, particularly in the form of walking, 20-30 minutes a day, if you can.   Engage in social activities in your community and with your family and try to keep up with current events by reading the newspaper or watching the news.   As far as your medications are concerned, I would like to suggest no changes needed in your meds.    As far as diagnostic testing: no new test needed at this time.  I would like to see you back in 6 months, sooner if we need to. Please call us with any interim questions, concerns, problems, updates or refill requests.  Please also call us for any test results so we can go over those with you on the phone. Brett Canales is my clinical assistant and will answer any of your questions and relay your messages to me and also relay most of my messages to you.  Our phone number is [phone]. We also have an after hours call service for urgent matters and there is a physician on-call for urgent questions. For any emergencies you know to call 911 or go to the nearest emergency room.

## 2012-08-19 NOTE — Progress Notes (Signed)
Subjective:    Patient ID: Frances [name] is a 67 y.o. female.  HPI  Interim history:   Frances [name] is a very pleasant 67 year old right-handed woman who presents for followup consultation of her dementia. She is accompanied by her POA, Estrellita Ludwig, today. This is her first visit after Dr. Imagene Gurney retirement and she last saw Dr. Fayrene Fearing love on 03/19/2012, at which time he felt that she should no longer drive. Her MMSE had worsened. She had a previous beta-amyloid PET scan which was indicative for Alzheimer's disease. She has an underlying medical history of hypertension, diabetes, hyperlipidemia, depression and anxiety. She is status post knee surgery, tonsillectomy and hysterectomy. She is currently on Aricept 23 mg, Lipitor, Lasix, metformin, fosinopril, Synthroid, baby aspirin, generic Celexa 10 mg. She was started on Vitamin E. She had a total of 4 sisters and one brother, one sister died. She lives in [place] with a friend. She is single and has no children of her own. She did a driving test in July 2014, and her assessment was: no night driving, no interstate driving, no more than 10 m radius, no more than 45 m zones. Her PCP is in agreement with that.   I reviewed Dr. Imagene Gurney prior notes and the patient's records and below is a summary of that review:  67 year old right-handed woman who has an at least 4 year history of memory loss. She had workup including B12 level, ESR, TSH which were normal in July 2012. MRI of the brain with and without contrast from July 2012 showed mild atrophy and small vessel disease. She had neuropsychological testing in October 2012 indicating severe impairment in her abilities to learn or retained auditory or verbal information. The findings were consistent with Alzheimer's disease. She had a fall some 4 years ago and injured her left leg and left hip. She stopped working in July 2012. Her mother and a sister had Alzheimer's disease. She has been on Aricept and  previously also tried [place] which she stopped. In November 2012 her MMSE was 24, in March 2013 her MMSE was 23, in July 2013 her MMSE was 6, in November 2013 her MMSE was 21, in March 2014 her MMSE was 16, clock drawing was 4, animal fluency was 6. Dr. Sandria Manly discussed restarting Namenda with them.  Her Past Medical History Is Significant For: Past Medical History  Diagnosis Date  . Hypertension   . Dementia   . Depressed   . Urinary incontinence     USI  . Cystocele   . Rectocele   . Thyroid disease   . Diabetes mellitus   . Endometriosis     Her Past Surgical History Is Significant For: Past Surgical History  Procedure Laterality Date  . Tonsillectomy    . Oophorectomy  1992    BSO  . Vaginal hysterectomy  1992    Vag Hyst BSO  . Knee surgery    . Hemorrhoid surgery    . Bladder suspension      A/P Repair-Sling  . Basal cell excised      Her Family History Is Significant For: Family History  Problem Relation Age of Onset  . Hypertension Mother   . Stroke Mother   . Heart failure Mother   . Hypertension Father   . Diabetes Sister   . Hypertension Sister   . Breast cancer Sister   . Heart disease Sister     Her Social History Is Significant For: History   Social History  .  Marital Status: Single    Spouse Name: N/A    Number of Children: N/A  . Years of Education: N/A   Social History Main Topics  . Smoking status: Never Smoker   . Smokeless tobacco: None  . Alcohol Use: No  . Drug Use: None  . Sexual Activity: No   Other Topics Concern  . None   Social History Narrative  . None    Her Allergies Are:  Allergies  Allergen Reactions  . Demerol [Meperidine]   . Flagyl [Metronidazole]   . Shellfish Allergy   :   Her Current Medications Are:  Outpatient Encounter Prescriptions as of 08/19/2012  Medication Sig Dispense Refill  . amLODipine (NORVASC) 2.5 MG tablet Take 2.5 mg by mouth daily.      Marland Kitchen aspirin 81 MG tablet Take 81 mg by mouth  daily.      Marland Kitchen atorvastatin (LIPITOR) 20 MG tablet Take 20 mg by mouth daily.      Marland Kitchen co-enzyme Q-10 30 MG capsule Take 30 mg by mouth daily.      Marland Kitchen donepezil (ARICEPT) 10 MG tablet Take 23 mg by mouth at bedtime as needed.      Marland Kitchen escitalopram (LEXAPRO) 10 MG tablet Take 1 tablet (10 mg total) by mouth daily.  90 tablet  3  . fosinopril (MONOPRIL) 40 MG tablet Take 40 mg by mouth daily.      Marland Kitchen levothyroxine (SYNTHROID, LEVOTHROID) 75 MCG tablet Take 75 mcg by mouth daily.      . Memantine HCl ER (NAMENDA XR) 28 MG CP24 Take 1 capsule by mouth daily.      . metFORMIN (GLUCOPHAGE) 500 MG tablet Take 500 mg by mouth 1 day or 1 dose.      . vitamin E (VITAMIN E) 1000 UNIT capsule Take 2,000 Units by mouth daily.      . cetirizine (ZYRTEC) 10 MG tablet Take 10 mg by mouth daily.      . [DISCONTINUED] furosemide (LASIX) 20 MG tablet Take 20 mg by mouth 2 (two) times daily.       No facility-administered encounter medications on file as of 08/19/2012.   Review of Systems  All other systems reviewed and are negative.    Objective:  Neurologic Exam  Physical Exam Physical Examination:   Filed Vitals:   08/19/12 0935  BP: 140/76  Pulse: 77    General Examination: The patient is a very pleasant 67 y.o. female in no acute distress. She is calm and cooperative with the exam. She denies Auditory Hallucinations, Visual Hallucinations and Tactile Hallucinations.   HEENT: Normocephalic, atraumatic, pupils are equal, round and reactive to light and accommodation. Funduscopic exam is normal with sharp disc margins noted. Extraocular tracking shows mild saccadic breakdown without nystagmus noted. Hearing is impaired mildly. Tympanic membranes are clear bilaterally. Face is symmetric with no facial masking and normal facial sensation. There is no lip, neck or jaw tremor. Neck is not rigid with intact passive ROM. There are no carotid bruits on auscultation. Oropharynx exam reveals mild mouth dryness. No  significant airway crowding is noted. Mallampati is class II. Tongue protrudes centrally and palate elevates symmetrically.    Chest: is clear to auscultation without wheezing, rhonchi or crackles noted.  Heart: sounds are regular and normal without murmurs, rubs or gallops noted.   Abdomen: is soft, non-tender and non-distended with normal bowel sounds appreciated on auscultation.  Extremities: There is no pitting edema in the distal lower extremities bilaterally. Pedal  pulses are intact.  Skin: is warm and dry with no trophic changes noted.  Musculoskeletal: exam reveals no obvious joint deformities, tenderness or joint swelling or erythema.  Neurologically:  Mental status: The patient is awake and alert, paying fair  attention. She is unable to provide major parts of the history. Frances [name] provides most of the history. She is oriented to: person, place and situation. Her memory, attention, language and knowledge are significantly impaired. There is no aphasia, agnosia, apraxia or anomia. There is a mild degree of bradyphrenia. Speech is moderately hypophonic with no dysarthria noted. Affect is blunted.  Her MMSE score is 17/30. CDT is 4/4. AFT (Animal Fluency Test) score is 5.  Cranial nerves are as described above under HEENT exam. In addition, shoulder shrug is normal with equal shoulder height noted.  Motor exam: Normal bulk, and strength for age is noted. Tone is not rigid with absence of cogwheeling. There is overall mild bradykinesia. There is no drift or rebound. There is no tremor.   Romberg is negative. Reflexes are 1+ in the upper extremities and 1+ in the lower extremities. Toes are downgoing bilaterally. Fine motor skills: Finger taps, hand movements, and rapid alternating patting are not significantly impaired bilaterally. Foot taps and foot agility are not significantly impaired bilaterally.   Cerebellar testing shows no dysmetria or intention tremor on finger to nose testing.  There is no truncal or gait ataxia. Heel-to-shin is normal bilaterally.  Sensory exam is intact to light touch in the upper and lower extremities.   Gait, station and balance: She stands up from the seated position with no difficulty. No veering to one side is noted. No leaning to one side. Posture is Age-appropriate. Stance is narrow-based. She turns in 3 steps. Tandem walk is possible. Balance is Not impaired.   Assessment and Plan:   In summary, Frances [name] is a very pleasant 67 y.o.-year old female with an underlying medical history of Hypertension who presents for followup consultation of her advanced dementia. She has remained stable over the past 5 months and I've reassured her in that regard. Her other neurological exam is nonfocal which is reassuring as well. I had a long chat with her and her friend today regarding this diagnosis, its prognosis and treatment options. She is on maximum doses of Aricept as well as Namenda at this time. I think we have in the least achieved some stability in her findings. I initially had significant reservations with regards to her driving but given that she had a recent driving assessment I will go ahead with those recommendations. Personally I explained to them I would recommend that she stays within 35 miles zones. She did not need any prescription refills today. I suggested that she see me back in 6 months, sooner if the need arises and they are encouraged to call with any interim questions, concerns, problems, updates. They were in agreement.

## 2012-10-21 DIAGNOSIS — L738 Other specified follicular disorders: Secondary | ICD-10-CM | POA: Diagnosis not present

## 2012-10-21 DIAGNOSIS — L570 Actinic keratosis: Secondary | ICD-10-CM | POA: Diagnosis not present

## 2012-10-21 DIAGNOSIS — L905 Scar conditions and fibrosis of skin: Secondary | ICD-10-CM | POA: Diagnosis not present

## 2012-10-21 DIAGNOSIS — Z85828 Personal history of other malignant neoplasm of skin: Secondary | ICD-10-CM | POA: Diagnosis not present

## 2012-10-26 DIAGNOSIS — I10 Essential (primary) hypertension: Secondary | ICD-10-CM | POA: Diagnosis not present

## 2012-10-26 DIAGNOSIS — E119 Type 2 diabetes mellitus without complications: Secondary | ICD-10-CM | POA: Diagnosis not present

## 2012-10-27 DIAGNOSIS — Z09 Encounter for follow-up examination after completed treatment for conditions other than malignant neoplasm: Secondary | ICD-10-CM | POA: Diagnosis not present

## 2012-12-10 NOTE — Telephone Encounter (Signed)
Patient completed apt

## 2013-01-05 DIAGNOSIS — I10 Essential (primary) hypertension: Secondary | ICD-10-CM | POA: Diagnosis not present

## 2013-01-12 DIAGNOSIS — I10 Essential (primary) hypertension: Secondary | ICD-10-CM | POA: Diagnosis not present

## 2013-01-27 DIAGNOSIS — E119 Type 2 diabetes mellitus without complications: Secondary | ICD-10-CM | POA: Diagnosis not present

## 2013-01-27 DIAGNOSIS — Z79899 Other long term (current) drug therapy: Secondary | ICD-10-CM | POA: Diagnosis not present

## 2013-01-27 DIAGNOSIS — I10 Essential (primary) hypertension: Secondary | ICD-10-CM | POA: Diagnosis not present

## 2013-01-27 DIAGNOSIS — E7800 Pure hypercholesterolemia, unspecified: Secondary | ICD-10-CM | POA: Diagnosis not present

## 2013-02-22 ENCOUNTER — Ambulatory Visit: Payer: Self-pay | Admitting: Neurology

## 2013-02-22 ENCOUNTER — Ambulatory Visit: Payer: Medicare Other | Admitting: Neurology

## 2013-02-24 ENCOUNTER — Encounter: Payer: Self-pay | Admitting: Neurology

## 2013-02-24 ENCOUNTER — Ambulatory Visit (INDEPENDENT_AMBULATORY_CARE_PROVIDER_SITE_OTHER): Payer: Medicare Other | Admitting: Neurology

## 2013-02-24 ENCOUNTER — Encounter (INDEPENDENT_AMBULATORY_CARE_PROVIDER_SITE_OTHER): Payer: Self-pay

## 2013-02-24 VITALS — BP 143/76 | HR 69 | Temp 97.8°F | Ht 67.5 in | Wt 173.0 lb

## 2013-02-24 DIAGNOSIS — F0280 Dementia in other diseases classified elsewhere without behavioral disturbance: Secondary | ICD-10-CM

## 2013-02-24 DIAGNOSIS — I10 Essential (primary) hypertension: Secondary | ICD-10-CM

## 2013-02-24 DIAGNOSIS — G309 Alzheimer's disease, unspecified: Secondary | ICD-10-CM | POA: Diagnosis not present

## 2013-02-24 DIAGNOSIS — R609 Edema, unspecified: Secondary | ICD-10-CM | POA: Diagnosis not present

## 2013-02-24 DIAGNOSIS — R600 Localized edema: Secondary | ICD-10-CM

## 2013-02-24 NOTE — Patient Instructions (Addendum)
I think overall you are doing fairly well and are stable at this point.   I do have some generic suggestions for you today:  Please make sure that you drink plenty of fluids. I would like for you to exercise daily for example in the form of walking 20-30 minutes every day, if you can. Please keep a regular sleep-wake schedule, keep regular meal times, do not skip any meals, eat  healthy snacks in between meals, such as fruit or nuts. Try to eat protein with every meal.   As far as your medications are concerned, I would like to suggest: no changes. Please call Dr. Carlyle Lipa office regarding your new leg swelling. Try to keep your feet up when you sit and look for over the counter mild compression stockings.    As far as diagnostic testing, I recommend: no new test. Please send [place] a copy of your driving assessment.   Engage in social activities in your community and with your family and try to keep up with current events by reading the newspaper or watching the news.  I do not think we need to make any changes in your medications at this point. I think you're stable enough that I can see you back in 6 months, sooner if we need to. Please call us if you have any interim questions, concerns, or problems or updates to need to discuss.  Our nursing staff will answer any of your questions and relay your messages to me and will give you my messages.   Our phone number is [phone]. We also have an after hours call service for urgent matters and there is a physician on-call for urgent questions. For any emergencies you know to call 911 or go to the nearest emergency room.

## 2013-02-24 NOTE — Progress Notes (Signed)
Subjective:    Patient ID: Frances [name] is a 68 y.o. female.  HPI    Interim history:   Ms. Frances [name] is a very pleasant 68 year old right-handed woman with an underlying medical history of hypertension, diabetes, hyperlipidemia, depression, anxiety, s/p knee surgery, tonsillectomy and hysterectomy, who presents for followup consultation of her dementia. She is accompanied by her POA, [name], again today. The patient lives with Frances [name]'s mother and Frances [name] lives next door. I first met her on 08/19/2012, which time her MMSE score was 17/30, CDT was 4/4, AFT score was 5. I kept her on the same medications, namely Aricept 23 mg strength as well as Namenda long-acting 28 mg once daily. Both medications are at maximum doses. We talked about her driving last time and I asked her to stay within the 35 mile zones.   Today, she is very limited in her speech and most of the Hx is provided by Frances [name]. She moved in with Frances [name]'s mother in 2012. She has retained her own home and currently it is empty. She is tearful, but does not tell me why. She has not been more depressed. Frances [name] feels, things are stable. She has a repeat driving test at the [facility] in July 2015. She had an increase in her amlodipine in January 2015, gradually from 2.5 mg to 10 mg. She has had some LE swelling. She sits a lot.   She was previously followed by Dr. Morene Antu and was last seen by Dr. Erling [name] on 03/19/2012, at which time he felt that she should no longer drive. Her MMSE had worsened. She had a previous beta-amyloid PET scan which was indicative of Alzheimer's disease. She had a total of 4 sisters and one brother, one sister died. She lives in [place] with Frances [name], age 13. She is single and has no children of her own. She did a driving test in July 2014, and the assessment was: no night driving, no interstate driving, no more than 10 m radius, no more than 45 m zones. Her PCP was in agreement with that.  She has an  approximately 5 year history of memory loss. She had workup including B12 level, ESR, TSH, which were normal in July 2012. MRI of the brain with and without contrast from July 2012 showed mild atrophy and small vessel disease. She had neuropsychological testing in October 2012 indicating severe impairment in her abilities to learn or retain auditory or verbal information. The findings were consistent with Alzheimer's disease. She had a fall some 5 years ago and injured her left leg and left hip. She stopped working in July 2012. Her mother and a sister had Alzheimer's disease. In November 2012 her MMSE was 24, in March 2013 her MMSE was 9, in July 2013 her MMSE was 38, in November 2013 her MMSE was 21, in March 2014 her MMSE was 16, CDT was 4, AFT was 6.     Her Past Medical History Is Significant For: Past Medical History  Diagnosis Date  . Hypertension   . Dementia   . Depressed   . Urinary incontinence     USI  . Cystocele   . Rectocele   . Thyroid disease   . Diabetes mellitus   . Endometriosis     Her Past Surgical History Is Significant For: Past Surgical History  Procedure Laterality Date  . Tonsillectomy    . Oophorectomy  1992    BSO  . Vaginal hysterectomy  1992  Vag Hyst BSO  . Knee surgery    . Hemorrhoid surgery    . Bladder suspension      A/P Repair-Sling  . Basal cell excised      Her Family History Is Significant For: Family History  Problem Relation Age of Onset  . Hypertension Mother   . Stroke Mother   . Heart failure Mother   . Hypertension Father   . Diabetes Sister   . Hypertension Sister   . Breast cancer Sister   . Heart disease Sister     Her Social History Is Significant For: History   Social History  . Marital Status: Single    Spouse Name: N/A    Number of Children: N/A  . Years of Education: N/A   Social History Main Topics  . Smoking status: Never Smoker   . Smokeless tobacco: None  . Alcohol Use: No  . Drug Use: None  .  Sexual Activity: No   Other Topics Concern  . None   Social History Narrative  . None    Her Allergies Are:  Allergies  Allergen Reactions  . Demerol [Meperidine]   . Flagyl [Metronidazole]   . Shellfish Allergy   :   Her Current Medications Are:  Outpatient Encounter Prescriptions as of 02/24/2013  Medication Sig  . amLODipine (NORVASC) 2.5 MG tablet Take 10 mg by mouth daily.   Marland Kitchen aspirin 81 MG tablet Take 81 mg by mouth daily.  Marland Kitchen atorvastatin (LIPITOR) 20 MG tablet Take 20 mg by mouth daily.  Marland Kitchen co-enzyme Q-10 30 MG capsule Take 30 mg by mouth daily.  Marland Kitchen donepezil (ARICEPT) 10 MG tablet Take 23 mg by mouth at bedtime as needed.  Marland Kitchen escitalopram (LEXAPRO) 10 MG tablet Take 1 tablet (10 mg total) by mouth daily.  . fosinopril (MONOPRIL) 40 MG tablet Take 40 mg by mouth daily.  Marland Kitchen levothyroxine (SYNTHROID, LEVOTHROID) 75 MCG tablet Take 75 mcg by mouth daily.  . Memantine HCl ER (NAMENDA XR) 28 MG CP24 Take 1 capsule by mouth daily.  . metFORMIN (GLUCOPHAGE) 500 MG tablet Take 500 mg by mouth 1 day or 1 dose.  . vitamin E (VITAMIN E) 1000 UNIT capsule Take 2,000 Units by mouth daily.  . cetirizine (ZYRTEC) 10 MG tablet Take 10 mg by mouth daily.  :  Review of Systems:  Out of a complete 14 point review of systems, all are reviewed and negative with the exception of these symptoms as listed below: Review of Systems  Constitutional: Negative.   HENT: Negative.   Eyes: Negative.   Respiratory: Negative.   Cardiovascular: Negative.   Gastrointestinal: Negative.   Endocrine: Negative.   Genitourinary: Negative.   Musculoskeletal: Negative.   Skin: Negative.   Allergic/Immunologic: Negative.   Neurological:       Memory loss  Hematological: Negative.   Psychiatric/Behavioral: Negative.     Objective:  Neurologic Exam  Physical Exam Physical Examination:   Filed Vitals:   02/24/13 1033  BP: 143/76  Pulse: 69  Temp: 97.8 F (36.6 C)   General Examination: The  patient is a very pleasant 68 y.o. female in no acute distress. She is calm and cooperative with the exam. She is rather quiet today and at one time tearful. She denies Auditory Hallucinations, Visual Hallucinations and Tactile Hallucinations.   HEENT: Normocephalic, atraumatic, pupils are equal, round and reactive to light and accommodation. Funduscopic exam is normal with sharp disc margins noted. Extraocular tracking shows mild saccadic breakdown without  nystagmus noted. Hearing is impaired mildly. Tympanic membranes are clear bilaterally. Face is symmetric with no facial masking and normal facial sensation. There is no lip, neck or jaw tremor. Neck is not rigid with intact passive ROM. There are no carotid bruits on auscultation. Oropharynx exam reveals mild mouth dryness. She has missing teeth. No significant airway crowding is noted. Mallampati is class II. Tongue protrudes centrally and palate elevates symmetrically.    Chest: is clear to auscultation without wheezing, rhonchi or crackles noted.  Heart: sounds are regular and normal without murmurs, rubs or gallops noted.   Abdomen: is soft, non-tender and non-distended with normal bowel sounds appreciated on auscultation.  Extremities: There is 1+ pitting edema in the distal lower extremities bilaterally, L more than R. Pedal pulses are intact.  Skin: is warm and dry with no trophic changes noted.  Musculoskeletal: exam reveals no obvious joint deformities, tenderness or joint swelling or erythema.  Neurologically:  Mental status: The patient is awake and alert, paying fair attention. She is unable to provide major parts of the history. Frances [name] provides most, essentially, all of the history. She is oriented to: person, place and situation. Her memory, attention, language and knowledge are significantly impaired. There is no aphasia, agnosia, apraxia or anomia. There is a mild degree of bradyphrenia. Speech is moderately hypophonic with no  dysarthria noted. Affect is blunted. Mood is depressed.  On 08/19/12: Her MMSE was 17/30. CDT was 4/4. AFT (Animal Fluency Test) score was 5.   Cranial nerves are as described above under HEENT exam. In addition, shoulder shrug is normal with equal shoulder height noted.  Motor exam: Normal bulk, and strength for age is noted. Tone is not rigid with absence of cogwheeling. There is overall mild bradykinesia. There is no drift or rebound. There is no tremor.   Romberg is negative. Reflexes are 1+ in the upper extremities and 1+ in the lower extremities. Fine motor skills: Finger taps, hand movements, and rapid alternating patting are not significantly impaired bilaterally. Foot taps and foot agility are not significantly impaired bilaterally.   Cerebellar testing shows no dysmetria or intention tremor on finger to nose testing. There is no truncal or gait ataxia. Heel-to-shin is normal bilaterally.  Sensory exam is intact to light touch in the upper and lower extremities.   Gait, station and balance: She stands up from the seated position with no difficulty. No veering to one side is noted. No leaning to one side. Posture is Age-appropriate. Stance is narrow-based. She turns in 3 steps. Tandem walk is possible. Balance is not impaired.   Assessment and Plan:   In summary, VARIE MACHAMER is a very pleasant 68 year old female with an underlying medical history of Hypertension who presents for followup consultation of her advanced dementia without behavioral problems. She has remained fairly stable per Frances [name], but appears more withdrawn to me. She could not tell me her age today. I worry about her driving, but am agreeable to her taking her test at the [facility] in July. Her other neurological exam is nonfocal which is reassuring. I do note a new lower extremity swelling which may be secondary to the increase in amlodipine. I have asked him to address this with her primary care physician. In the interim, I  have asked her to keep her feet up when she sits and she can use over-the-counter mild compression type socks. I again had a long chat with her and her friend today regarding the diagnosis of dementia,  its prognosis and treatment options. She is on maximum doses of Aricept as well as Namenda at this time. I think we have in the least achieved some stability in her findings, but the disease is progressive. She did not need any prescription refills today. We talked about medical treatments and non-pharmacological approaches. We talked about maintaining a healthy lifestyle in general and staying active mentally and physically. I encouraged the patient to eat healthy, exercise daily and keep well hydrated, to keep a scheduled bedtime and wake time routine, to not skip any meals and eat healthy snacks in between meals and to have protein with every meal. I stressed the importance of regular exercise, within of course the patient's own mobility limitations. I encouraged the patient to keep up with current events by reading the news paper or watching the news.   As far as further diagnostic testing is concerned, I suggested the following: no new test.  As far as medications are concerned, I recommended the following at this time: no change.  I answered all their questions today and the patient and Frances [name] were in agreement with the above outlined plan. I would like to see the patient back in 6 months, sooner if the need arises and encouraged them to call with any interim questions, concerns, problems, updates and refill requests.

## 2013-06-08 ENCOUNTER — Other Ambulatory Visit: Payer: Self-pay | Admitting: Neurology

## 2013-08-17 DIAGNOSIS — E119 Type 2 diabetes mellitus without complications: Secondary | ICD-10-CM | POA: Diagnosis not present

## 2013-08-17 DIAGNOSIS — I10 Essential (primary) hypertension: Secondary | ICD-10-CM | POA: Diagnosis not present

## 2013-08-17 DIAGNOSIS — Z1331 Encounter for screening for depression: Secondary | ICD-10-CM | POA: Diagnosis not present

## 2013-08-17 DIAGNOSIS — Z79899 Other long term (current) drug therapy: Secondary | ICD-10-CM | POA: Diagnosis not present

## 2013-08-17 DIAGNOSIS — N951 Menopausal and female climacteric states: Secondary | ICD-10-CM | POA: Diagnosis not present

## 2013-08-17 DIAGNOSIS — E7800 Pure hypercholesterolemia, unspecified: Secondary | ICD-10-CM | POA: Diagnosis not present

## 2013-08-17 DIAGNOSIS — Z23 Encounter for immunization: Secondary | ICD-10-CM | POA: Diagnosis not present

## 2013-08-17 DIAGNOSIS — E039 Hypothyroidism, unspecified: Secondary | ICD-10-CM | POA: Diagnosis not present

## 2013-08-17 DIAGNOSIS — Z0000 Encounter for general adult medical examination without abnormal findings: Secondary | ICD-10-CM | POA: Diagnosis not present

## 2013-08-24 ENCOUNTER — Ambulatory Visit (INDEPENDENT_AMBULATORY_CARE_PROVIDER_SITE_OTHER): Payer: Medicare Other | Admitting: Neurology

## 2013-08-24 ENCOUNTER — Encounter: Payer: Self-pay | Admitting: Neurology

## 2013-08-24 VITALS — BP 129/70 | HR 71 | Temp 97.4°F | Ht 67.0 in | Wt 154.0 lb

## 2013-08-24 DIAGNOSIS — F0280 Dementia in other diseases classified elsewhere without behavioral disturbance: Secondary | ICD-10-CM | POA: Diagnosis not present

## 2013-08-24 DIAGNOSIS — R609 Edema, unspecified: Secondary | ICD-10-CM | POA: Diagnosis not present

## 2013-08-24 DIAGNOSIS — G309 Alzheimer's disease, unspecified: Secondary | ICD-10-CM | POA: Diagnosis not present

## 2013-08-24 DIAGNOSIS — R600 Localized edema: Secondary | ICD-10-CM

## 2013-08-24 NOTE — Patient Instructions (Signed)
Your memory loss has become somewhat worse.  We have you on maximum dose of Aricept and Namenda. We will continue with them.  You should no longer drive.  Follow up in 6 months.

## 2013-08-24 NOTE — Progress Notes (Signed)
Subjective:    Patient ID: Frances [name] is a 68 y.o. female.  HPI    Interim history:   Frances [name] is a very pleasant 68 year old right-handed woman with an underlying medical history of hypertension, diabetes, hyperlipidemia, depression, anxiety, s/p knee surgery, tonsillectomy and hysterectomy, who presents for followup consultation of her dementia. She is accompanied by her POA, Frances [name], again today. The patient lives with Frances [name] and Frances [name]. I last saw her on 02/24/2013, at which time I talked to her about her driving and expressed my concerns. I did not change her medications and kept her on Aricept and Namenda. She was supposed to take her driving test at the [facility] in July.  Today, Frances [name] tells me, that Frances [name] has not driven a car in months and did not take her driving test in July. Frances [name] feels Frances [name]'s memory has become worse. The patient does not know why she is here today but denies any physical complaints. She was recently seen by her primary care physician for a physical and had no medication changes.  I first met her on 08/19/2012, which time her MMSE score was 17/30, CDT was 4/4, AFT score was 5. I kept her on the same medications, namely Aricept 23 mg strength as well as Namenda long-acting 28 mg once daily. Both medications are at maximum doses. We talked about her driving last time and I asked her to stay within the 35 mile zones.  She was previously followed by Frances [name] and was last seen by Frances [name] on 03/19/2012, at which time he felt that she should no longer drive. Her MMSE had worsened. She had a previous beta-amyloid PET scan which was indicative of Alzheimer's disease. She had a total of 4 Frances [name] and one Frances [name], one Frances [name] died and had memory loss and other medical issues (per Frances [name] she may have been on Aricept as well). She lives in [place] with Frances [name], age 16. She is single and has no children of her own. She did a  driving test in July 2014, and the assessment was: no night driving, no interstate driving, no more than 10 m radius, no more than 45 m zones. Her PCP was in agreement with that.  She has an approximately 5 year history of memory loss. She had workup including B12 level, ESR, TSH, which were normal in July 2012. MRI of the brain with and without contrast from July 2012 showed mild atrophy and small vessel disease. She had neuropsychological testing in October 2012 indicating severe impairment in her abilities to learn or retain auditory or verbal information. The findings were consistent with Alzheimer's disease. She had a fall some 5 years ago and injured her left leg and left hip. She stopped working in July 2012. Her [name] and a Frances [name] had Alzheimer's disease. In November 2012 her MMSE was 24, in March 2013 her MMSE was 52, in July 2013 her MMSE was 8, in November 2013 her MMSE was 21, in March 2014 her MMSE was 16, CDT was 4, AFT was 6. She was started on Aricept by her PCP in 7/12 and Namenda was added maybe in 2013.   Her Past Medical History Is Significant For: Past Medical History  Diagnosis Date  . Hypertension   . Dementia   . Depressed   . Urinary incontinence     USI  . Cystocele   . Rectocele   . Thyroid disease   . Diabetes mellitus   .  Endometriosis     Her Past Surgical History Is Significant For: Past Surgical History  Procedure Laterality Date  . Tonsillectomy    . Oophorectomy  1992    BSO  . Vaginal hysterectomy  1992    Vag Hyst BSO  . Knee surgery    . Hemorrhoid surgery    . Bladder suspension      A/P Repair-Sling  . Basal cell excised      Her Family History Is Significant For: Family History  Problem Relation Age of Onset  . Hypertension [name]   . Stroke [name]   . Heart failure [name]   . Hypertension Frances [name]   . Diabetes Frances [name]   . Hypertension Frances [name]   . Breast cancer Frances [name]   . Heart disease Frances [name]     Her Social History Is Significant  For: History   Social History  . Marital Status: Single    Spouse Name: N/A    Number of Children: N/A  . Years of Education: N/A   Social History Main Topics  . Smoking status: Never Smoker   . Smokeless tobacco: Never Used  . Alcohol Use: No  . Drug Use: None  . Sexual Activity: No   Other Topics Concern  . None   Social History Narrative   Patient is right handed and resides with friend    Her Allergies Are:  Allergies  Allergen Reactions  . Demerol [Meperidine]   . Flagyl [Metronidazole]   . Shellfish Allergy   :   Her Current Medications Are:  Outpatient Encounter Prescriptions as of 08/24/2013  Medication Sig  . amLODipine (NORVASC) 2.5 MG tablet Take 10 mg by mouth daily.   Marland Kitchen aspirin 81 MG tablet Take 81 mg by mouth daily.  Marland Kitchen atorvastatin (LIPITOR) 20 MG tablet Take 20 mg by mouth daily.  . cetirizine (ZYRTEC) 10 MG tablet Take 10 mg by mouth daily. As needed  . co-enzyme Q-10 30 MG capsule Take 30 mg by mouth daily.  Marland Kitchen donepezil (ARICEPT) 10 MG tablet Take 23 mg by mouth at bedtime as needed.  Marland Kitchen escitalopram (LEXAPRO) 10 MG tablet TAKE 1 TABLET ( 10 MG TOTAL ) DAILY  . fosinopril (MONOPRIL) 40 MG tablet Take 40 mg by mouth daily.  Marland Kitchen levothyroxine (SYNTHROID, LEVOTHROID) 75 MCG tablet Take 75 mcg by mouth daily.  . Memantine HCl ER (NAMENDA XR) 28 MG CP24 Take 1 capsule by mouth daily.  . metFORMIN (GLUCOPHAGE) 500 MG tablet Take 500 mg by mouth 1 day or 1 dose.  . vitamin E (VITAMIN E) 1000 UNIT capsule Take 2,000 Units by mouth daily.  :  Review of Systems:  Out of a complete 14 point review of systems, all are reviewed and negative with the exception of these symptoms as listed below:   Review of Systems  Neurological:       Memory loss    Objective:  Neurologic Exam  Physical Exam Physical Examination:   Filed Vitals:   08/24/13 1202  BP: 129/70  Pulse: 71  Temp: 97.4 F (36.3 C)   General Examination: The patient is a very pleasant 68  y.o. female in no acute distress. She is calm and cooperative with the exam and again very quiet today but not tearful. She denies Auditory Hallucinations, Visual Hallucinations and Tactile Hallucinations.   HEENT: Normocephalic, atraumatic, pupils are equal, round and reactive to light and accommodation. Extraocular tracking shows mild saccadic breakdown without nystagmus noted. Hearing is impaired mildly. Face is symmetric  with no facial masking and normal facial sensation. There is no lip, neck or jaw tremor. Neck is not rigid with intact passive ROM. There are no carotid bruits on auscultation. Oropharynx exam reveals mild mouth dryness. She has missing teeth. No significant airway crowding is noted. Mallampati is class II. Tongue protrudes centrally and palate elevates symmetrically.    Chest: is clear to auscultation without wheezing, rhonchi or crackles noted.  Heart: sounds are regular and normal without murmurs, rubs or gallops noted.   Abdomen: is soft, non-tender and non-distended with normal bowel sounds appreciated on auscultation.  Extremities: There is 1+ pitting edema in the distal lower extremities bilaterally, L more than R. Pedal pulses are intact.  Skin: is warm and dry with no trophic changes noted.  Musculoskeletal: exam reveals no obvious joint deformities, tenderness or joint swelling or erythema.  Neurologically:  Mental status: The patient is awake and alert, paying fair attention. She is unable to provide major parts of the history. Frances [name] provides most, essentially, all of the history. She is oriented to: person, place and situation. Her memory, attention, language and knowledge are significantly impaired. There is a moderate degree of bradyphrenia. Speech is moderately hypophonic with no dysarthria noted. Affect is blunted. Mood is depressed. On 08/19/12: Her MMSE was 17/30. CDT was 4/4. AFT (Animal Fluency Test) score was 5. 08/24/2013: MMSE: 14/30, CDT 2/4, AFT 4.    Cranial nerves are as described above under HEENT exam. In addition, shoulder shrug is normal with equal shoulder height noted.  Motor exam: Normal bulk, and strength for age is noted. Tone is not rigid with absence of cogwheeling. There is overall mild bradykinesia. There is no drift or rebound. There is no tremor.   Romberg is negative. Reflexes are 1+ in the upper extremities and 1+ in the lower extremities. Fine motor skills: Finger taps, hand movements, and rapid alternating patting are not significantly impaired bilaterally. Foot taps and foot agility are not significantly impaired bilaterally.   Cerebellar testing shows no dysmetria or intention tremor on finger to nose testing. There is no truncal or gait ataxia. Heel-to-shin is normal bilaterally.  Sensory exam is intact to light touch in the upper and lower extremities.   Gait, station and balance: She stands up from the seated position with no difficulty. No veering to one side is noted. No leaning to one side. Posture is Age-appropriate. Stance is narrow-based. She turns in 3 steps. Tandem walk is possible. Balance is not impaired.   Assessment and Plan:   In summary, AVENELL SELLERS is a very pleasant 68 year old female with an underlying medical history of Hypertension and thyroid d/s, who presents for followup consultation of her advanced dementia without behavioral problems. She has become worse clinically and her memory scores have declined as well. I suggested she no longer drive. Unfortunately she has progressed despite being on maximum doses of Aricept as well as Namenda at this time. We mutually agreed to keep going with the medications. Consideration would be to take her off of Aricept gradually and taper it off and introduce another memory medication such as Exelon or Razadyne, however, there is no guarantee that either one of them would be more efficacious or beneficial for her. At this juncture, we will continue with the  current regimen and I pointed out to the patient and her POA that she has progressed. She did not need any refills today and we will see her back in about 6 months.

## 2013-08-25 ENCOUNTER — Telehealth: Payer: Self-pay | Admitting: *Deleted

## 2013-08-25 ENCOUNTER — Telehealth: Payer: Self-pay | Admitting: Neurology

## 2013-08-25 NOTE — Telephone Encounter (Signed)
Form  to be filled out by PCP.

## 2013-08-25 NOTE — Telephone Encounter (Signed)
Gave to [employer], will contact patient POA

## 2013-08-31 DIAGNOSIS — M899 Disorder of bone, unspecified: Secondary | ICD-10-CM | POA: Diagnosis not present

## 2013-08-31 DIAGNOSIS — M949 Disorder of cartilage, unspecified: Secondary | ICD-10-CM | POA: Diagnosis not present

## 2013-09-13 DIAGNOSIS — M810 Age-related osteoporosis without current pathological fracture: Secondary | ICD-10-CM | POA: Diagnosis not present

## 2013-10-26 DIAGNOSIS — L905 Scar conditions and fibrosis of skin: Secondary | ICD-10-CM | POA: Diagnosis not present

## 2013-10-26 DIAGNOSIS — Z85828 Personal history of other malignant neoplasm of skin: Secondary | ICD-10-CM | POA: Diagnosis not present

## 2013-10-26 DIAGNOSIS — L853 Xerosis cutis: Secondary | ICD-10-CM | POA: Diagnosis not present

## 2013-10-26 DIAGNOSIS — L814 Other melanin hyperpigmentation: Secondary | ICD-10-CM | POA: Diagnosis not present

## 2013-11-11 DIAGNOSIS — Z803 Family history of malignant neoplasm of breast: Secondary | ICD-10-CM | POA: Diagnosis not present

## 2013-11-11 DIAGNOSIS — Z1231 Encounter for screening mammogram for malignant neoplasm of breast: Secondary | ICD-10-CM | POA: Diagnosis not present

## 2013-12-13 DIAGNOSIS — M810 Age-related osteoporosis without current pathological fracture: Secondary | ICD-10-CM | POA: Diagnosis not present

## 2014-01-25 ENCOUNTER — Telehealth: Payer: Self-pay | Admitting: Neurology

## 2014-01-25 NOTE — Telephone Encounter (Signed)
Spoke with Rhonda(POA), and rescheduled and confirmed template time change.

## 2014-02-10 ENCOUNTER — Other Ambulatory Visit: Payer: Self-pay | Admitting: Neurology

## 2014-02-15 DIAGNOSIS — E559 Vitamin D deficiency, unspecified: Secondary | ICD-10-CM | POA: Diagnosis not present

## 2014-02-15 DIAGNOSIS — G309 Alzheimer's disease, unspecified: Secondary | ICD-10-CM | POA: Diagnosis not present

## 2014-02-15 DIAGNOSIS — Z79899 Other long term (current) drug therapy: Secondary | ICD-10-CM | POA: Diagnosis not present

## 2014-02-15 DIAGNOSIS — F325 Major depressive disorder, single episode, in full remission: Secondary | ICD-10-CM | POA: Diagnosis not present

## 2014-02-15 DIAGNOSIS — E039 Hypothyroidism, unspecified: Secondary | ICD-10-CM | POA: Diagnosis not present

## 2014-02-15 DIAGNOSIS — E780 Pure hypercholesterolemia: Secondary | ICD-10-CM | POA: Diagnosis not present

## 2014-02-15 DIAGNOSIS — E119 Type 2 diabetes mellitus without complications: Secondary | ICD-10-CM | POA: Diagnosis not present

## 2014-02-15 DIAGNOSIS — I10 Essential (primary) hypertension: Secondary | ICD-10-CM | POA: Diagnosis not present

## 2014-02-16 DIAGNOSIS — E119 Type 2 diabetes mellitus without complications: Secondary | ICD-10-CM | POA: Diagnosis not present

## 2014-02-24 ENCOUNTER — Ambulatory Visit (INDEPENDENT_AMBULATORY_CARE_PROVIDER_SITE_OTHER): Payer: Medicare Other | Admitting: Neurology

## 2014-02-24 ENCOUNTER — Encounter: Payer: Self-pay | Admitting: Neurology

## 2014-02-24 VITALS — BP 92/58 | HR 77 | Temp 98.5°F | Wt 160.0 lb

## 2014-02-24 DIAGNOSIS — G309 Alzheimer's disease, unspecified: Secondary | ICD-10-CM | POA: Diagnosis not present

## 2014-02-24 DIAGNOSIS — R031 Nonspecific low blood-pressure reading: Secondary | ICD-10-CM

## 2014-02-24 DIAGNOSIS — F0280 Dementia in other diseases classified elsewhere without behavioral disturbance: Secondary | ICD-10-CM

## 2014-02-24 NOTE — Progress Notes (Signed)
Subjective:    Patient ID: Frances [name] is a 69 y.o. female.  HPI     Interim history:   Frances [name] is a very pleasant 68 year old right-handed woman with an underlying medical history of hypertension, diabetes, hyperlipidemia, depression, anxiety, s/p knee surgery, tonsillectomy and hysterectomy, who presents for followup consultation of her dementia. She is accompanied by her POA, Frances [name], again today. The patient lives with Frances [name] and Frances [name] lives next door. I last saw her on 08/24/2013, at which time her MMSE was 14 out of 30, clock drawing was 2 out of 4, and animal fluency was 4/m. I kept her on the current medication regimen. We talked about potentially tapering Aricept and trying another memory drug instead but we mutually agreed to continue the current regimen. I asked her not to drive any longer. She did not take her driving test in July 2015. Frances [name] felt that the patient's memory was indeed worse.   Today, the patient reports no new issues. She has a lower blood pressure reading today but no symptoms of lightheadedness, dizziness, sleepiness or lethargy. Frances [name] feels that memory has gradually worsened. No new set and issues are reported. No headaches. Patient does not drive. She has not driven in over a year. She has a good appetite.  I saw her on 02/24/2013, at which time I talked to her about her driving and expressed my concerns. I did not change her medications and kept her on Aricept and Namenda. She was supposed to [facility] July.   I first met her on 08/19/2012, which time her MMSE score was 17/30, CDT was 4/4, AFT score was 5. I kept her on the same medications, namely Aricept 23 mg strength as well as Namenda long-acting 28 mg once daily. Both medications are at maximum doses. We talked about her driving last time and I asked her to stay within the 35 mile zones.   She was previously followed by Dr. Morene Antu and was last seen by  Dr. Erling [name] on 03/19/2012, at which time he felt that she should no longer drive. Her MMSE had worsened. She had a previous beta-amyloid PET scan which was indicative of Alzheimer's disease. She had a total of 4 sisters and one brother, one sister died and had memory loss and other medical issues (per Frances [name] she may have been on Aricept as well). She lives in [place] with [place], age 41. She is single and has no children of her own. She did a driving test in July 2014, and the assessment was: no night driving, no interstate driving, no more than 10 m radius, no more than 45 m zones. Her PCP was in agreement with that.   She has an approximately 5 year history of memory loss. She had workup including B12 level, ESR, TSH, which were normal in July 2012. MRI of the brain with and without contrast from July 2012 showed mild atrophy and small vessel disease. She had neuropsychological testing in October 2012 indicating severe impairment in her abilities to learn or retain auditory or verbal information. The findings were consistent with Alzheimer's disease. She had a fall some 5 years ago and injured her left leg and left hip. She stopped working in July 2012. Her [name] and a sister had Alzheimer's disease. In November 2012 her MMSE was 24, in March 2013 her MMSE was 51, in July 2013 her MMSE was 32, in November 2013 her MMSE was  21, in March 2014 her MMSE was 16, CDT was 4, AFT was 6. She was started on Aricept by her PCP in 7/12 and Namenda was added maybe in 2013.   Her Past Medical History Is Significant For: Past Medical History  Diagnosis Date  . Hypertension   . Dementia   . Depressed   . Urinary incontinence     USI  . Cystocele   . Rectocele   . Thyroid disease   . Diabetes mellitus   . Endometriosis     Her Past Surgical History Is Significant For: Past Surgical History  Procedure Laterality Date  . Tonsillectomy    . Oophorectomy  1992    BSO  . Vaginal hysterectomy  1992     Vag Hyst BSO  . Knee surgery    . Hemorrhoid surgery    . Bladder suspension      A/P Repair-Sling  . Basal cell excised      Her Family History Is Significant For: Family History  Problem Relation Age of Onset  . Hypertension [name]   . Stroke [name]   . Heart failure [name]   . Hypertension Father   . Diabetes Sister   . Hypertension Sister   . Breast cancer Sister   . Heart disease Sister     Her Social History Is Significant For: History   Social History  . Marital Status: Single    Spouse Name: N/A  . Number of Children: N/A  . Years of Education: N/A   Social History Main Topics  . Smoking status: Never Smoker   . Smokeless tobacco: Never Used  . Alcohol Use: No  . Drug Use: Not on file  . Sexual Activity: No   Other Topics Concern  . None   Social History Narrative   Patient is right handed and resides with friend    Her Allergies Are:  Allergies  Allergen Reactions  . Demerol [Meperidine]   . Flagyl [Metronidazole]   . Shellfish Allergy   :   Her Current Medications Are:  Outpatient Encounter Prescriptions as of 02/24/2014  Medication Sig  . amLODipine (NORVASC) 10 MG tablet   . aspirin 81 MG tablet Take 81 mg by mouth daily.  Marland Kitchen atorvastatin (LIPITOR) 20 MG tablet Take 20 mg by mouth daily.  . cetirizine (ZYRTEC) 10 MG tablet Take 10 mg by mouth daily. As needed  . co-enzyme Q-10 30 MG capsule Take 30 mg by mouth daily.  Marland Kitchen donepezil (ARICEPT) 23 MG TABS tablet   . escitalopram (LEXAPRO) 10 MG tablet TAKE 1 TABLET DAILY  . fosinopril (MONOPRIL) 40 MG tablet Take 40 mg by mouth daily.  Marland Kitchen levothyroxine (SYNTHROID, LEVOTHROID) 75 MCG tablet Take 75 mcg by mouth daily.  . Memantine HCl ER (NAMENDA XR) 28 MG CP24 Take 1 capsule by mouth daily.  . metFORMIN (GLUCOPHAGE) 500 MG tablet Take 500 mg by mouth 1 day or 1 dose.  . Vitamin D, Ergocalciferol, (DRISDOL) 50000 UNITS CAPS capsule   . vitamin E (VITAMIN E) 1000 UNIT capsule Take 2,000 Units by  mouth daily.  . [DISCONTINUED] amLODipine (NORVASC) 2.5 MG tablet Take 10 mg by mouth daily.   . [DISCONTINUED] donepezil (ARICEPT) 10 MG tablet Take 23 mg by mouth at bedtime as needed.  :  Review of Systems:  Out of a complete 14 point review of systems, all are reviewed and negative with the exception of these symptoms as listed below:   Review of Systems  All  other systems reviewed and are negative.   Objective:  Neurologic Exam  Physical Exam Physical Examination:   Filed Vitals:   02/24/14 1027  BP: 92/58  Pulse: 77  Temp: 98.5 F (36.9 C)   General Examination: The patient is a very pleasant 69 y.o. female in no acute distress. She is calm and cooperative with the exam and again very quiet today but is in good spirits, smiling appropriately.   HEENT: Normocephalic, atraumatic, pupils are equal, round and reactive to light and accommodation. Extraocular tracking shows mild saccadic breakdown without nystagmus noted. Hearing is impaired mildly. Face is symmetric with no facial masking and normal facial sensation. There is no lip, neck or jaw tremor. Neck is not rigid with intact passive ROM. There are no carotid bruits on auscultation. Oropharynx exam reveals mild mouth dryness. She has missing teeth. No significant airway crowding is noted. Mallampati is class II. Tongue protrudes centrally and palate elevates symmetrically.    Chest: is clear to auscultation without wheezing, rhonchi or crackles noted.  Heart: sounds are regular and normal without murmurs, rubs or gallops noted.   Abdomen: is soft, non-tender and non-distended with normal bowel sounds appreciated on auscultation.  Extremities: There is 1+ pitting edema in the distal lower extremities bilaterally, L more than R. Pedal pulses are intact.  Skin: is warm and dry with no trophic changes noted.  Musculoskeletal: exam reveals no obvious joint deformities, tenderness or joint swelling or  erythema.  Neurologically:  Mental status: The patient is awake and alert, paying fair attention. She is unable to provide major parts of the history. Frances [name] provides most, essentially, all of the history. She is oriented to: person, place and situation. Her memory, attention, language and knowledge are significantly impaired. There is a moderate degree of bradyphrenia. Speech is moderately hypophonic with no dysarthria noted. Affect is blunted. Mood is depressed.  On 08/19/12: Her MMSE was 17/30. CDT was 4/4. AFT (Animal Fluency Test) score was 5.  On 08/24/2013: MMSE: 14/30, CDT 2/4, AFT 4.  On 02/24/2014: MMSE: 12/30, CDT: 4/4, AFT: 3/min, GDS: 3/15.  Cranial nerves are as described above under HEENT exam. In addition, shoulder shrug is normal with equal shoulder height noted.  Motor exam: Normal bulk, and strength for age is noted. Tone is not rigid with absence of cogwheeling. There is overall mild bradykinesia. There is no drift or rebound. There is no tremor.   Romberg is negative. Reflexes are 1+ in the upper extremities and 1+ in the lower extremities. Fine motor skills: Finger taps, hand movements, and rapid alternating patting are not significantly impaired bilaterally. Foot taps and foot agility are not significantly impaired bilaterally.   Cerebellar testing shows no dysmetria or intention tremor on finger to nose testing. There is no truncal or gait ataxia. Heel-to-shin is normal bilaterally.  Sensory exam is intact to light touch in the upper and lower extremities.   Gait, station and balance: She stands up from the seated position with no difficulty. No veering to one side is noted. No leaning to one side. Posture is Age-appropriate. Stance is narrow-based. She turns in 3 steps. Tandem walk is possible. Balance is not impaired.   Assessment and Plan:   In summary, WALLY BEHAN is a very pleasant 69 year old female with an underlying medical history of Hypertension and thyroid  d/s, who presents for followup consultation of her advanced dementia without behavioral problems. Her memory scores have continually declined slowly over the last couple of years.  She has been on maximum doses of Aricept as well as Namenda. She no longer drives and is in a safe environment, never alone. Frances [name] helps with showering. Patient lives with Frances [name] who is in good health and still drives at the age of 77. We mutually agreed to keep going with the medications. Consideration would be to take her off of Aricept gradually and taper it off and introduce another memory medication such as Exelon or Razadyne, however, there is no guarantee that either one of them would be more efficacious or beneficial for her or without new side effects. At least she has been able to tolerate the medications. They did not need any refills at this time and I will reevaluate her in 6 months, sooner if need be. Frances [name] is encouraged to monitor patient's blood pressure in the next couple of days and if they are consistently low to get in touch with the primary care physician. Sometime ago because of high blood pressure readings the amlodipine had been increased from 5 mg to 10 mg. They are encouraged to call with any interim questions. They were in agreement.

## 2014-02-24 NOTE — Patient Instructions (Signed)
We will continue with Aricept and Namenda XR at the current doses. Follow up in 6 months.  Try to walk regularly and drink plenty of fluid.  Monitor blood pressure at home for the next couple of days.

## 2014-05-17 ENCOUNTER — Ambulatory Visit (INDEPENDENT_AMBULATORY_CARE_PROVIDER_SITE_OTHER): Payer: Medicare Other | Admitting: Podiatry

## 2014-05-17 ENCOUNTER — Encounter: Payer: Self-pay | Admitting: Podiatry

## 2014-05-17 VITALS — BP 117/64 | HR 66 | Resp 12

## 2014-05-17 DIAGNOSIS — L609 Nail disorder, unspecified: Secondary | ICD-10-CM | POA: Diagnosis not present

## 2014-05-17 DIAGNOSIS — E119 Type 2 diabetes mellitus without complications: Secondary | ICD-10-CM

## 2014-05-17 DIAGNOSIS — L608 Other nail disorders: Secondary | ICD-10-CM

## 2014-05-17 NOTE — Patient Instructions (Signed)
Okay to go to the pedicurist to trim toenails Do not place feet in whirlpool Do not manipulate the cuticles  Diabetes and Foot Care Diabetes may cause you to have problems because of poor blood supply (circulation) to your feet and legs. This may cause the skin on your feet to become thinner, break easier, and heal more slowly. Your skin may become dry, and the skin may peel and crack. You may also have nerve damage in your legs and feet causing decreased feeling in them. You may not notice minor injuries to your feet that could lead to infections or more serious problems. Taking care of your feet is one of the most important things you can do for yourself.  HOME CARE INSTRUCTIONS  Wear shoes at all times, even in the house. Do not go barefoot. Bare feet are easily injured.  Check your feet daily for blisters, cuts, and redness. If you cannot see the bottom of your feet, use a mirror or ask someone for help.  Wash your feet with warm water (do not use hot water) and mild soap. Then pat your feet and the areas between your toes until they are completely dry. Do not soak your feet as this can dry your skin.  Apply a moisturizing lotion or petroleum jelly (that does not contain alcohol and is unscented) to the skin on your feet and to dry, brittle toenails. Do not apply lotion between your toes.  Trim your toenails straight across. Do not dig under them or around the cuticle. File the edges of your nails with an emery board or nail file.  Do not cut corns or calluses or try to remove them with medicine.  Wear clean socks or stockings every day. Make sure they are not too tight. Do not wear knee-high stockings since they may decrease blood flow to your legs.  Wear shoes that fit properly and have enough cushioning. To break in new shoes, wear them for just a few hours a day. This prevents you from injuring your feet. Always look in your shoes before you put them on to be sure there are no objects  inside.  Do not cross your legs. This may decrease the blood flow to your feet.  If you find a minor scrape, cut, or break in the skin on your feet, keep it and the skin around it clean and dry. These areas may be cleansed with mild soap and water. Do not cleanse the area with peroxide, alcohol, or iodine.  When you remove an adhesive bandage, be sure not to damage the skin around it.  If you have a wound, look at it several times a day to make sure it is healing.  Do not use heating pads or hot water bottles. They may burn your skin. If you have lost feeling in your feet or legs, you may not know it is happening until it is too late.  Make sure your health care provider performs a complete foot exam at least annually or more often if you have foot problems. Report any cuts, sores, or bruises to your health care provider immediately. SEEK MEDICAL CARE IF:   You have an injury that is not healing.  You have cuts or breaks in the skin.  You have an ingrown nail.  You notice redness on your legs or feet.  You feel burning or tingling in your legs or feet.  You have pain or cramps in your legs and feet.  Your legs  or feet are numb.  Your feet always feel cold. SEEK IMMEDIATE MEDICAL CARE IF:   There is increasing redness, swelling, or pain in or around a wound.  There is a red line that goes up your leg.  Pus is coming from a wound.  You develop a fever or as directed by your health care provider.  You notice a bad smell coming from an ulcer or wound. Document Released: 12/21/1999 Document Revised: 08/25/2012 Document Reviewed: 06/01/2012 [facility] Patient Information 2015 [place], [place]. This information is not intended to replace advice given to you by your health care provider. Make sure you discuss any questions you have with your health care provider.

## 2014-05-17 NOTE — Progress Notes (Signed)
   Subjective:    Patient ID: Osvaldo Human, female    DOB: [birth year], 69 y.o.   MRN: [id number]  HPI  N-DISCOLORATION L-B/L TOENAILS D-LONG TERM O-SLOWLY C-LONGER A-PRESSURE T-TRIM  ALSO, CHECK DIABETIC FEET  Patient denies any history of claudication or foot ulceration  Review of Systems  All other systems reviewed and are negative.      Objective:   Physical Exam  The patient's friend Ron the who is also power of attorney is in the room today and is helpinf patient respond to questioning  Vascular: DP pulses 2/4 bilaterally PT pulses 2/4 bilaterally  Neurological: Sensation to 10 g monofilament wire intact 5/5 bilaterally Vibratory sensation nonreactive bilaterally Ankle reflex equal and reactive bilaterally  Dermatological: Texture and turgor within normal bilaterally No skin lesions noted bilaterally The toenails are normal trophic and mildly incurvated 6-10  Musculoskeletal: HAV deformities bilaterally There is no restriction ankle, subtalar, midtarsal joints bilaterally      Assessment & Plan:   Assessment: Satisfactory vascular status Mild peripheral neuropathy Asymptomatic HAV deformities Incurvated toenails 6-10 non-dystrophic  Plan: Review the results of examination with patient and power of attorney, [name] today Advised patient to wear soft wide toe box shoes Today I did debrided the toenails 6-10 without any bleeding  Advised patient that I recommended follow-up nail debridement with pedicurist with the instructions to the patient and power of attorney present no whirlpool soaks and no manipulation of the cuticles. I recommended just simple trimming the toenails and smoothing of the free edges  Reappoint yearly or at patient's request

## 2014-08-24 ENCOUNTER — Ambulatory Visit: Payer: Medicare Other | Admitting: Neurology

## 2014-08-28 ENCOUNTER — Ambulatory Visit: Payer: Self-pay | Admitting: Neurology

## 2014-09-06 ENCOUNTER — Ambulatory Visit (INDEPENDENT_AMBULATORY_CARE_PROVIDER_SITE_OTHER): Payer: Medicare Other | Admitting: Neurology

## 2014-09-06 ENCOUNTER — Encounter: Payer: Self-pay | Admitting: Neurology

## 2014-09-06 VITALS — BP 122/62 | HR 68 | Resp 14 | Ht 67.0 in | Wt 157.0 lb

## 2014-09-06 DIAGNOSIS — R600 Localized edema: Secondary | ICD-10-CM

## 2014-09-06 DIAGNOSIS — F0280 Dementia in other diseases classified elsewhere without behavioral disturbance: Secondary | ICD-10-CM

## 2014-09-06 DIAGNOSIS — G309 Alzheimer's disease, unspecified: Secondary | ICD-10-CM | POA: Diagnosis not present

## 2014-09-06 NOTE — Progress Notes (Signed)
Subjective:    Patient ID: Frances [name] is a 69 y.o. female.  HPI     Interim history:  [name] is a very pleasant 69 year old right-handed woman with an underlying medical history of hypertension, diabetes, hyperlipidemia, depression, anxiety, s/p knee surgery, tonsillectomy and hysterectomy, who presents for followup consultation of her dementia. She is accompanied by her POA, [name], again today. The patient lives with Frances [name] and Frances [name] lives next door. I last saw her on 02/24/14, at which time the patient reported no new issues. She had a lower blood pressure reading, but denied symptoms of lightheadedness, dizziness, sleepiness or lethargy. Frances [name] felt that patient's memory had gradually worsened. Patient does not drive. She has not driven in over a year. She reported a good appetite. MMSE was 12/30, CDT 4/4, AFT was 3/min at that visit. I suggested we continue with Aricept and Namenda long-acting.   Today, 09/06/2014: She reports feeling okay. She denies any pain or headaches. She denies any recent falls. Frances [name] reports no acute or new issues. She may not be getting enough physical activity and may not always drink enough fluid. No recent illness or medication changes are reported. She seems to be tolerating her memory medications. Balance is intact and no recent falls are reported. She will see her primary care physician this week.  Previously:   I saw her on 08/24/2013, at which time her MMSE was 14 out of 30, clock drawing was 2 out of 4, and animal fluency was 4/m. I kept her on the current medication regimen. We talked about potentially tapering Aricept and trying another memory drug instead but we mutually agreed to continue the current regimen. I asked her not to drive any longer. She did not take her driving test in July 2015. Frances [name] felt that the patient's memory was indeed worse.    I saw her on 02/24/2013, at which time I talked to her about her driving and  expressed my concerns. I did not change her medications and kept her on Aricept and Namenda. She was supposed to take her driving test at the [facility] in July.   I first met her on 08/19/2012, which time her MMSE score was 17/30, CDT was 4/4, AFT score was 5. I kept her on the same medications, namely Aricept 23 mg strength as well as Namenda long-acting 28 mg once daily. Both medications are at maximum doses. We talked about her driving last time and I asked her to stay within the 35 mile zones.   She was previously followed by Dr. Morene Antu and was last seen by Dr. Erling [name] on 03/19/2012, at which time he felt that she should no longer drive. Her MMSE had worsened. She had a previous beta-amyloid PET scan which was indicative of Alzheimer's disease. She had a total of 4 sisters and one brother, one sister died and had memory loss and other medical issues (per Frances [name] she may have been on Aricept as well). She lives in [place] with Frances [name], age 42. She is single and has no children of her own. She did a driving test in July 2014, and the assessment was: no night driving, no interstate driving, no more than 10 m radius, no more than 45 m zones. Her PCP was in agreement with that.   She has an approximately 5 year history of memory loss. She had workup including B12 level, ESR, TSH, which were normal in July 2012. MRI of the brain with and without  contrast from July 2012 showed mild atrophy and small vessel disease. She had neuropsychological testing in October 2012 indicating severe impairment in her abilities to learn or retain auditory or verbal information. The findings were consistent with Alzheimer's disease. She had a fall some 5 years ago and injured her left leg and left hip. She stopped working in July 2012. Her [name] and a sister had Alzheimer's disease. In November 2012 her MMSE was 24, in March 2013 her MMSE was 68, in July 2013 her MMSE was 75, in November 2013 her MMSE was 21, in March 2014 her  MMSE was 16, CDT was 4, AFT was 6. She was started on Aricept by her PCP in 7/12 and Namenda was added maybe in 2013.   Her Past Medical History Is Significant For: Past Medical History  Diagnosis Date  . Hypertension   . Dementia   . Depressed   . Urinary incontinence     USI  . Cystocele   . Rectocele   . Thyroid disease   . Diabetes mellitus   . Endometriosis     Her Past Surgical History Is Significant For: Past Surgical History  Procedure Laterality Date  . Tonsillectomy    . Oophorectomy  1992    BSO  . Vaginal hysterectomy  1992    Vag Hyst BSO  . Knee surgery    . Hemorrhoid surgery    . Bladder suspension      A/P Repair-Sling  . Basal cell excised      Her Family History Is Significant For: Family History  Problem Relation Age of Onset  . Hypertension [name]   . Stroke [name]   . Heart failure [name]   . Hypertension Father   . Diabetes Sister   . Hypertension Sister   . Breast cancer Sister   . Heart disease Sister     Her Social History Is Significant For: Social History   Social History  . Marital Status: Single    Spouse Name: N/A  . Number of Children: N/A  . Years of Education: N/A   Social History Main Topics  . Smoking status: Never Smoker   . Smokeless tobacco: Never Used  . Alcohol Use: No  . Drug Use: Not on file  . Sexual Activity: No   Other Topics Concern  . Not on file   Social History Narrative   Patient is right handed and resides with friend    Her Allergies Are:  Allergies  Allergen Reactions  . Demerol [Meperidine]   . Flagyl [Metronidazole]   . Shellfish Allergy   :   Her Current Medications Are:  Outpatient Encounter Prescriptions as of 09/06/2014  Medication Sig  . amLODipine (NORVASC) 10 MG tablet 20 mg.   . aspirin 81 MG tablet Take 81 mg by mouth daily.  Marland Kitchen atorvastatin (LIPITOR) 20 MG tablet Take 20 mg by mouth daily.  . cetirizine (ZYRTEC) 10 MG tablet Take 10 mg by mouth daily. As needed  .  cholecalciferol (VITAMIN D) 400 UNITS TABS tablet Take 400 Units by mouth.  . co-enzyme Q-10 30 MG capsule Take 30 mg by mouth daily.  Marland Kitchen donepezil (ARICEPT) 23 MG TABS tablet   . escitalopram (LEXAPRO) 10 MG tablet TAKE 1 TABLET DAILY  . fosinopril (MONOPRIL) 40 MG tablet Take 40 mg by mouth daily.  Marland Kitchen levothyroxine (SYNTHROID, LEVOTHROID) 75 MCG tablet Take 75 mcg by mouth daily.  . Memantine HCl ER (NAMENDA XR) 28 MG CP24 Take 1 capsule  by mouth daily.  . metFORMIN (GLUCOPHAGE) 1000 MG tablet   . Vitamin D, Ergocalciferol, (DRISDOL) 50000 UNITS CAPS capsule   . vitamin E (VITAMIN E) 1000 UNIT capsule Take 2,000 Units by mouth daily.  . [DISCONTINUED] metFORMIN (GLUCOPHAGE) 500 MG tablet Take 500 mg by mouth 1 day or 1 dose.   No facility-administered encounter medications on file as of 09/06/2014.  :  Review of Systems:  Out of a complete 14 point review of systems, all are reviewed and negative with the exception of these symptoms as listed below:  Review of Systems  All other systems reviewed and are negative.   Objective:  Neurologic Exam  Physical Exam Physical Examination:   Filed Vitals:   09/06/14 1145  BP: 122/62  Pulse: 68  Resp: 14   General Examination: The patient is a very pleasant 69 y.o. female in no acute distress. She is calm and cooperative with the exam and less quiet or withdrawn today.   HEENT: Normocephalic, atraumatic, pupils are equal, round and reactive to light and accommodation. Extraocular tracking shows mild saccadic breakdown without nystagmus noted. Hearing is impaired mildly. Face is symmetric with no facial masking and normal facial sensation. There is no lip, neck or jaw tremor. Neck is not rigid with intact passive ROM. There are no carotid bruits on auscultation. Oropharynx exam reveals mild mouth dryness. She has missing teeth. No significant airway crowding is noted. Mallampati is class II. Tongue protrudes centrally and palate elevates  symmetrically.    Chest: is clear to auscultation without wheezing, rhonchi or crackles noted.  Heart: sounds are regular and normal without murmurs, rubs or gallops noted.   Abdomen: is soft, non-tender and non-distended with normal bowel sounds appreciated on auscultation.  Extremities: There is trace edema in the distal lower extremities bilaterally, better than last time. Pedal pulses are intact.  Skin: is warm and dry with no trophic changes noted.  Musculoskeletal: exam reveals no obvious joint deformities, tenderness or joint swelling or erythema.  Neurologically:  Mental status: The patient is awake and alert, paying fair attention. She is unable to provide major parts of the history. Frances [name] provides most, essentially, all of the history. She is oriented to: person, place and situation. Her memory, attention, language and knowledge are significantly impaired. There is a moderate degree of bradyphrenia. Speech is moderately hypophonic with no dysarthria noted. Affect is blunted. Mood is depressed.  On 08/19/2012: MMSE: 17/30. CDT 4/4. AFT (Animal Fluency Test) score was 5.  On 08/24/2013: MMSE: 14/30, CDT 2/4, AFT 4.  On 02/24/2014: MMSE: 12/30, CDT: 4/4, AFT: 3/min, GDS: 3/15. On 09/06/2014: MMSE: 11/30, CDT: 1/4, AFT: 4/min.   Cranial nerves are as described above under HEENT exam. In addition, shoulder shrug is normal with equal shoulder height noted.  Motor exam: Normal bulk, and strength for age is noted. Tone is not rigid with absence of cogwheeling. There is overall mild bradykinesia. There is no drift or rebound. There is no tremor.   Romberg is negative. Reflexes are 1+ in the upper extremities and 1+ in the lower extremities. Fine motor skills: Finger taps, hand movements, and rapid alternating patting are not significantly impaired bilaterally. Foot taps and foot agility are not significantly impaired bilaterally.   Cerebellar testing shows no dysmetria or intention tremor on  finger to nose testing. There is no truncal or gait ataxia. Heel-to-shin is normal bilaterally.  Sensory exam is intact to light touch in the upper and lower extremities.   Gait,  station and balance: She stands up from the seated position with no difficulty. No veering to one side is noted. No leaning to one side. Posture is Age-appropriate. Stance is narrow-based. She turns in 3 steps. Tandem walk is possible. Balance is not impaired.   Assessment and Plan:   In summary, ANAYIA EUGENE is a very pleasant 69 year old female with an underlying medical history of Hypertension and thyroid d/s, who presents for followup consultation of her advanced dementia without behavioral problems. Her memory scores have continually declined slowly over the last few years. She has been on maximum doses of Aricept as well as Namenda. She no longer drives and is in a safe environment, never alone. Frances [name] helps with showering. Patient lives with Frances [name] who is in good health and still drives at the age of 21. We mutually agreed to keep going with the medications. She has been able to tolerate the medications. They did not need any refills at this time and today I suggested as needed follow-up. I requested that she follow-up with her primary care physician and if okay with her PCP he may be able to continue her prescriptions as there have been no recent changes. I would be happy to see her back on an as-needed basis. Her blood pressure was better today. I answered all their questions today and the patient and Romberg were in agreement.  I spent 20 minutes in total face-to-face time with the patient, more than 50% of which was spent in counseling and coordination of care, reviewing test results, reviewing medication and discussing or reviewing the diagnosis of dementia, its prognosis and treatment options.

## 2014-09-06 NOTE — Patient Instructions (Signed)
I would recommend you continue with Aricept 23 mg daily and Namenda XR 28 mg daily. If your primary care physician is comfortable maintaining your 2 memory medications, I recommend you follow up with him regularly and I could see you as needed.  Your exam is stable for the most part.   Continue to hydrate well with water and try to exercise daily.

## 2014-09-08 DIAGNOSIS — Z0000 Encounter for general adult medical examination without abnormal findings: Secondary | ICD-10-CM | POA: Diagnosis not present

## 2014-09-08 DIAGNOSIS — E119 Type 2 diabetes mellitus without complications: Secondary | ICD-10-CM | POA: Diagnosis not present

## 2014-09-08 DIAGNOSIS — Z79899 Other long term (current) drug therapy: Secondary | ICD-10-CM | POA: Diagnosis not present

## 2014-09-08 DIAGNOSIS — I10 Essential (primary) hypertension: Secondary | ICD-10-CM | POA: Diagnosis not present

## 2014-09-08 DIAGNOSIS — G309 Alzheimer's disease, unspecified: Secondary | ICD-10-CM | POA: Diagnosis not present

## 2014-09-08 DIAGNOSIS — Z1389 Encounter for screening for other disorder: Secondary | ICD-10-CM | POA: Diagnosis not present

## 2014-09-08 DIAGNOSIS — F325 Major depressive disorder, single episode, in full remission: Secondary | ICD-10-CM | POA: Diagnosis not present

## 2014-09-08 DIAGNOSIS — E780 Pure hypercholesterolemia: Secondary | ICD-10-CM | POA: Diagnosis not present

## 2014-10-31 DIAGNOSIS — L905 Scar conditions and fibrosis of skin: Secondary | ICD-10-CM | POA: Diagnosis not present

## 2014-10-31 DIAGNOSIS — L570 Actinic keratosis: Secondary | ICD-10-CM | POA: Diagnosis not present

## 2014-10-31 DIAGNOSIS — Z85828 Personal history of other malignant neoplasm of skin: Secondary | ICD-10-CM | POA: Diagnosis not present

## 2014-10-31 DIAGNOSIS — L814 Other melanin hyperpigmentation: Secondary | ICD-10-CM | POA: Diagnosis not present

## 2014-11-28 DIAGNOSIS — Z1231 Encounter for screening mammogram for malignant neoplasm of breast: Secondary | ICD-10-CM | POA: Diagnosis not present

## 2014-11-28 DIAGNOSIS — Z803 Family history of malignant neoplasm of breast: Secondary | ICD-10-CM | POA: Diagnosis not present

## 2014-12-06 DIAGNOSIS — Z23 Encounter for immunization: Secondary | ICD-10-CM | POA: Diagnosis not present

## 2015-01-05 ENCOUNTER — Ambulatory Visit (INDEPENDENT_AMBULATORY_CARE_PROVIDER_SITE_OTHER): Payer: Medicare Other | Admitting: Podiatry

## 2015-01-05 ENCOUNTER — Encounter: Payer: Self-pay | Admitting: Podiatry

## 2015-01-05 VITALS — BP 134/73 | HR 73 | Resp 14

## 2015-01-05 DIAGNOSIS — L03032 Cellulitis of left toe: Secondary | ICD-10-CM

## 2015-01-05 DIAGNOSIS — E119 Type 2 diabetes mellitus without complications: Secondary | ICD-10-CM

## 2015-01-05 DIAGNOSIS — B351 Tinea unguium: Secondary | ICD-10-CM | POA: Diagnosis not present

## 2015-01-05 DIAGNOSIS — L600 Ingrowing nail: Secondary | ICD-10-CM | POA: Diagnosis not present

## 2015-01-05 DIAGNOSIS — M79676 Pain in unspecified toe(s): Secondary | ICD-10-CM | POA: Diagnosis not present

## 2015-01-05 MED ORDER — CEPHALEXIN 500 MG PO CAPS: 500.0000 mg | ORAL_CAPSULE | Freq: Three times a day (TID) | ORAL | Status: DC

## 2015-01-09 NOTE — Progress Notes (Signed)
Patient ID: Osvaldo Human, female   DOB: 1945-04-04, 69 y.o.   MRN: [id number]  Subjective: 70 year old female presents the office today with concerns of possible infection the left big toe which has been ongoing for possibly one week. The patient presents to the family member who states that the dose been somewhat red around the toenail on the left side. The denies any drainage or pus. She also states her nails are elongated and thick causing discomfort particular shoe gear and pressure. There is no drainage or pus or redness from the other toenails. No other complaints at this time.  Objective: AAO 3, NAD  Protective sensation intact with Simms Weinstein monofilament  DP/PT pulses 2/4, CRT less than 3 seconds On the left lateral portion of the hallux toenail upon debridement there is a small amount of purulence expressed and there is underlying ulceration. The nail does appear to be somewhat loose and underlying nailbed. The nail was debrided of all loose toenail and to remove and drain the infection. There is localized edema and erythema around the toenail that any ascending cellulitis. There is mild tenderness to palpation along toenail. All the toenails. Be hypertrophic, dystrophic, brittle, discolored, elongated 10. No other areas of edema or erythema around the other toenails. No open lesions or pre-ulcerative lesions otherwise. There is no pain with calf compression, swelling, warmth, erythema.  Assessment: 70 year old female left lateral hallux localized infection ingrown toenail; symptomatic onychomycosis  Plan: -Treatment options discussed including all alternatives, risks, and complications -Left hallux nail was debrided and the area was drained. We'll start Keflex. Epson salt soaks daily. Antibiotic ointment and a bandage daily. Monitor for any clinical signs or symptoms of infection and directed to call the office immediately should any occur or go to the ER. -Remainder the  toenails were debrided 10 without complication/bleeding -Follow-up as scheduled or sooner if any problems arise. In the meantime, encouraged to call the office with any questions, concerns, change in symptoms.  *If left hallux toenail not resolved this appointment discussed nail avulsion. They wish to hold off for now.  Celesta Gentile, DPM

## 2015-01-19 ENCOUNTER — Other Ambulatory Visit: Payer: Self-pay | Admitting: Neurology

## 2015-01-19 NOTE — Telephone Encounter (Signed)
Would you like to fill, or prefer PCP refill this med?  Thank you.

## 2015-01-23 ENCOUNTER — Ambulatory Visit (INDEPENDENT_AMBULATORY_CARE_PROVIDER_SITE_OTHER): Payer: Medicare Other | Admitting: Podiatry

## 2015-01-23 ENCOUNTER — Encounter: Payer: Self-pay | Admitting: Podiatry

## 2015-01-23 VITALS — BP 128/66 | HR 69 | Resp 16

## 2015-01-23 DIAGNOSIS — L603 Nail dystrophy: Secondary | ICD-10-CM

## 2015-01-24 NOTE — Progress Notes (Signed)
Patient ID: Frances [name], female   DOB: 04-19-1945, 70 y.o.   MRN: [id number]  Subjective:  patient presents the office today for follow up evaluation of infection to the left hallux toenail. She states that she finish the course of antibiotics and she is not having any pain to the toenail. She denies any pus or drainage. Denies any surrounding redness or red streaks. She was soaking Epsom salts soaks. Denies any systemic complaints such as fevers, chills, nausea, vomiting. No acute changes since last appointment, and no other complaints at this time.   Objective: AAO x3, NAD DP/PT pulses palpable bilaterally, CRT less than 3 seconds Protective sensation intact with Simms Weinstein monofilament Left hallux nail appears be hypertrophic, dystrophic, discolored, brittle and loosen the underlying nailbed distally but firmly adhered proximally. Upon debridement there is no drainage or pus or underlying ulceration. There is no clinical signs of infection. No surrounding erythema, ascending synovitis, fluctuance, crepitus or malodor. No other areas of tenderness to bilateral lower extremities. No areas of pinpoint bony tenderness or pain with vibratory sensation. MMT 5/5, ROM WNL. No edema, erythema, increase in warmth to bilateral lower extremities.  No open lesions or pre-ulcerative lesions.  No pain with calf compression, swelling, warmth, erythema  Assessment: Resolved infection left hallux toenail  Plan: -All treatment options discussed with the patient including all alternatives, risks, complications.  - nails debrided without any complications or bleeding. Continue to monitor for any recurrence and call the office should any occur. -Follow-up in 3 months for routine care or sooner if any problems arise. In the meantime, encouraged to call the office with any questions, concerns, change in symptoms.   Celesta Gentile, DPM

## 2015-01-25 ENCOUNTER — Emergency Department (HOSPITAL_COMMUNITY): Payer: Medicare Other

## 2015-01-25 ENCOUNTER — Encounter (HOSPITAL_COMMUNITY): Payer: Self-pay | Admitting: Emergency Medicine

## 2015-01-25 ENCOUNTER — Emergency Department (HOSPITAL_COMMUNITY)
Admission: EM | Admit: 2015-01-25 | Discharge: 2015-01-25 | Disposition: A | Discharge status: Home / Self Care | Payer: Medicare Other | Attending: Emergency Medicine | Admitting: Emergency Medicine

## 2015-01-25 DIAGNOSIS — S098XXA Other specified injuries of head, initial encounter: Secondary | ICD-10-CM | POA: Diagnosis not present

## 2015-01-25 DIAGNOSIS — S199XXA Unspecified injury of neck, initial encounter: Secondary | ICD-10-CM | POA: Diagnosis not present

## 2015-01-25 DIAGNOSIS — F329 Major depressive disorder, single episode, unspecified: Secondary | ICD-10-CM | POA: Insufficient documentation

## 2015-01-25 DIAGNOSIS — E119 Type 2 diabetes mellitus without complications: Secondary | ICD-10-CM | POA: Insufficient documentation

## 2015-01-25 DIAGNOSIS — W19XXXA Unspecified fall, initial encounter: Secondary | ICD-10-CM | POA: Diagnosis not present

## 2015-01-25 DIAGNOSIS — Y9289 Other specified places as the place of occurrence of the external cause: Secondary | ICD-10-CM | POA: Insufficient documentation

## 2015-01-25 DIAGNOSIS — Z043 Encounter for examination and observation following other accident: Secondary | ICD-10-CM | POA: Diagnosis not present

## 2015-01-25 DIAGNOSIS — R42 Dizziness and giddiness: Secondary | ICD-10-CM | POA: Diagnosis not present

## 2015-01-25 DIAGNOSIS — Z79899 Other long term (current) drug therapy: Secondary | ICD-10-CM | POA: Insufficient documentation

## 2015-01-25 DIAGNOSIS — R55 Syncope and collapse: Secondary | ICD-10-CM | POA: Insufficient documentation

## 2015-01-25 DIAGNOSIS — R220 Localized swelling, mass and lump, head: Secondary | ICD-10-CM | POA: Diagnosis not present

## 2015-01-25 DIAGNOSIS — S0101XA Laceration without foreign body of scalp, initial encounter: Secondary | ICD-10-CM | POA: Insufficient documentation

## 2015-01-25 DIAGNOSIS — Y998 Other external cause status: Secondary | ICD-10-CM | POA: Insufficient documentation

## 2015-01-25 DIAGNOSIS — S0990XA Unspecified injury of head, initial encounter: Secondary | ICD-10-CM | POA: Diagnosis present

## 2015-01-25 DIAGNOSIS — E079 Disorder of thyroid, unspecified: Secondary | ICD-10-CM | POA: Insufficient documentation

## 2015-01-25 DIAGNOSIS — Y9389 Activity, other specified: Secondary | ICD-10-CM | POA: Insufficient documentation

## 2015-01-25 DIAGNOSIS — Z7984 Long term (current) use of oral hypoglycemic drugs: Secondary | ICD-10-CM | POA: Insufficient documentation

## 2015-01-25 DIAGNOSIS — I10 Essential (primary) hypertension: Secondary | ICD-10-CM | POA: Insufficient documentation

## 2015-01-25 DIAGNOSIS — Z8742 Personal history of other diseases of the female genital tract: Secondary | ICD-10-CM | POA: Insufficient documentation

## 2015-01-25 DIAGNOSIS — F0390 Unspecified dementia without behavioral disturbance: Secondary | ICD-10-CM | POA: Insufficient documentation

## 2015-01-25 DIAGNOSIS — S0190XA Unspecified open wound of unspecified part of head, initial encounter: Secondary | ICD-10-CM | POA: Diagnosis not present

## 2015-01-25 DIAGNOSIS — Z7982 Long term (current) use of aspirin: Secondary | ICD-10-CM | POA: Insufficient documentation

## 2015-01-25 LAB — COMPREHENSIVE METABOLIC PANEL
ALBUMIN: 3.9 g/dL (ref 3.5–5.0)
ALT: 12 U/L — AB (ref 14–54)
AST: 19 U/L (ref 15–41)
Alkaline Phosphatase: 57 U/L (ref 38–126)
Anion gap: 10 (ref 5–15)
BILIRUBIN TOTAL: 0.5 mg/dL (ref 0.3–1.2)
BUN: 11 mg/dL (ref 6–20)
CO2: 27 mmol/L (ref 22–32)
CREATININE: 1.09 mg/dL — AB (ref 0.44–1.00)
Calcium: 9.1 mg/dL (ref 8.9–10.3)
Chloride: 103 mmol/L (ref 101–111)
GFR calc Af Amer: 59 mL/min — ABNORMAL LOW (ref >60)
GFR, EST NON AFRICAN AMERICAN: 51 mL/min — AB (ref >60)
GLUCOSE: 155 mg/dL — AB (ref 65–99)
POTASSIUM: 3.2 mmol/L — AB (ref 3.5–5.1)
Sodium: 140 mmol/L (ref 135–145)
TOTAL PROTEIN: 6.9 g/dL (ref 6.5–8.1)

## 2015-01-25 LAB — CBC
HEMATOCRIT: 39.9 % (ref 36.0–46.0)
HEMOGLOBIN: 13.3 g/dL (ref 12.0–15.0)
MCH: 30.4 pg (ref 26.0–34.0)
MCHC: 33.3 g/dL (ref 30.0–36.0)
MCV: 91.3 fL (ref 78.0–100.0)
PLATELETS: 229 10*3/uL (ref 150–400)
RBC: 4.37 MIL/uL (ref 3.87–5.11)
RDW: 12.4 % (ref 11.5–15.5)
WBC: 5.2 10*3/uL (ref 4.0–10.5)

## 2015-01-25 LAB — CBG MONITORING, ED: Glucose-Capillary: 147 mg/dL — ABNORMAL HIGH (ref 65–99)

## 2015-01-25 NOTE — ED Notes (Signed)
Per EMS, pt was in [employer] when she had a witnessed syncopal episode. Pt fell and hit head on the counter. EMS reports a large dent in counter and approx 2 in laceration to back of head. Pt reports feeling dizzy before she "blacked out." Pt's daughter en route to ED. Daughter witnessed syncopal episode. Pt has dementia. CBG 132.

## 2015-01-25 NOTE — ED Notes (Signed)
Pt's caregiver arrived and is at bedside.

## 2015-01-25 NOTE — Discharge Instructions (Signed)
Keep the wound dry for 24 hours. Staples can be removed here by her regular doctor in 7-10 days. Return for any new or worse symptoms to include passing out.   Laceration Care, Adult A laceration is a cut that goes through all layers of the skin. The cut also goes into the tissue that is right under the skin. Some cuts heal on their own. Others need to be closed with stitches (sutures), staples, skin adhesive strips, or wound glue. Taking care of your cut lowers your risk of infection and helps your cut to heal better. HOW TO TAKE CARE OF YOUR CUT For stitches or staples:  Keep the wound clean and dry.  If you were given a bandage (dressing), you should change it at least one time per day or as told by your doctor. You should also change it if it gets wet or dirty.  Keep the wound completely dry for the first 24 hours or as told by your doctor. After that time, you may take a shower or a bath. However, make sure that the wound is not soaked in water until after the stitches or staples have been removed.  Clean the wound one time each day or as told by your doctor:  Wash the wound with soap and water.  Rinse the wound with water until all of the soap comes off.  Pat the wound dry with a clean towel. Do not rub the wound.  After you clean the wound, put a thin layer of antibiotic ointment on it as told by your doctor. This ointment:  Helps to prevent infection.  Keeps the bandage from sticking to the wound.  Have your stitches or staples removed as told by your doctor. If your doctor used skin adhesive strips:   Keep the wound clean and dry.  If you were given a bandage, you should change it at least one time per day or as told by your doctor. You should also change it if it gets dirty or wet.  Do not get the skin adhesive strips wet. You can take a shower or a bath, but be careful to keep the wound dry.  If the wound gets wet, pat it dry with a clean towel. Do not rub the  wound.  Skin adhesive strips fall off on their own. You can trim the strips as the wound heals. Do not remove any strips that are still stuck to the wound. They will fall off after a while. If your doctor used wound glue:  Try to keep your wound dry, but you may briefly wet it in the shower or bath. Do not soak the wound in water, such as by swimming.  After you take a shower or a bath, gently pat the wound dry with a clean towel. Do not rub the wound.  Do not do any activities that will make you really sweaty until the skin glue has fallen off on its own.  Do not apply liquid, cream, or ointment medicine to your wound while the skin glue is still on.  If you were given a bandage, you should change it at least one time per day or as told by your doctor. You should also change it if it gets dirty or wet.  If a bandage is placed over the wound, do not let the tape for the bandage touch the skin glue.  Do not pick at the glue. The skin glue usually stays on for 5-10 days. Then, it falls  off of the skin. General Instructions  To help prevent scarring, make sure to cover your wound with sunscreen whenever you are outside after stitches are removed, after adhesive strips are removed, or when wound glue stays in place and the wound is healed. Make sure to wear a sunscreen of at least 30 SPF.  Take over-the-counter and prescription medicines only as told by your doctor.  If you were given antibiotic medicine or ointment, take or apply it as told by your doctor. Do not stop using the antibiotic even if your wound is getting better.  Do not scratch or pick at the wound.  Keep all follow-up visits as told by your doctor. This is important.  Check your wound every day for signs of infection. Watch for:  Redness, swelling, or pain.  Fluid, blood, or pus.  Raise (elevate) the injured area above the level of your heart while you are sitting or lying down, if possible. GET HELP IF:  You got a  tetanus shot and you have any of these problems at the injection site:  Swelling.  Very bad pain.  Redness.  Bleeding.  You have a fever.  A wound that was closed breaks open.  You notice a bad smell coming from your wound or your bandage.  You notice something coming out of the wound, such as wood or glass.  Medicine does not help your pain.  You have more redness, swelling, or pain at the site of your wound.  You have fluid, blood, or pus coming from your wound.  You notice a change in the color of your skin near your wound.  You need to change the bandage often because fluid, blood, or pus is coming from the wound.  You start to have a new rash.  You start to have numbness around the wound. GET HELP RIGHT AWAY IF:  You have very bad swelling around the wound.  Your pain suddenly gets worse and is very bad.  You notice painful lumps near the wound or on skin that is anywhere on your body.  You have a red streak going away from your wound.  The wound is on your hand or foot and you cannot move a finger or toe like you usually can.  The wound is on your hand or foot and you notice that your fingers or toes look pale or bluish.   This information is not intended to replace advice given to you by your health care provider. Make sure you discuss any questions you have with your health care provider.   Document Released: 06/11/2007 Document Revised: 05/09/2014 Document Reviewed: 12/19/2013 Elsevier Interactive Patient Education [employer].

## 2015-01-25 NOTE — ED Notes (Signed)
MD Zackowski at bedside updating patient and family. 

## 2015-01-25 NOTE — ED Provider Notes (Addendum)
CSN: [id number]     Arrival date & time 01/25/15  1547 History   First MD Initiated Contact with Patient 01/25/15 [phone]     Chief Complaint  Patient presents with  . Loss of Consciousness  . Head Laceration     (Consider location/radiation/quality/duration/timing/severity/associated sxs/prior Treatment) Patient is a 70 y.o. female presenting with syncope and scalp laceration. The history is provided by the patient, the EMS personnel and a caregiver.  Loss of Consciousness Head Laceration   patient was at [employer] with caregiver when she fell. According to caregiver there was no loss of consciousness. Patient does report feeling dizzy right prior. Caregiver states that occasionally she does have dizzy episodes but they are not frequent. There was also no loss of consciousness. Does not appear to be a syncopal episode. Patient struck the back of her head hard. Resulting in bleeding. EMS reported laceration to the posterior part of the scalp. Patient without any other complaints. Patient has a history of dementia but is alert. Caregiver states that patient's tetanus is up-to-date.  Past Medical History  Diagnosis Date  . Hypertension   . Dementia   . Depressed   . Urinary incontinence     USI  . Cystocele   . Rectocele   . Thyroid disease   . Diabetes mellitus   . Endometriosis    Past Surgical History  Procedure Laterality Date  . Tonsillectomy    . Oophorectomy  1992    BSO  . Vaginal hysterectomy  1992    Vag Hyst BSO  . Knee surgery    . Hemorrhoid surgery    . Bladder suspension      A/P Repair-Sling  . Basal cell excised     Family History  Problem Relation Age of Onset  . Hypertension Mother   . Stroke Mother   . Heart failure Mother   . Hypertension Father   . Diabetes Sister   . Hypertension Sister   . Breast cancer Sister   . Heart disease Sister    Social History  Substance Use Topics  . Smoking status: Never Smoker   . Smokeless tobacco: Never Used   . Alcohol Use: No   OB History    Gravida Para Term Preterm AB TAB SAB Ectopic Multiple Living   0              Review of Systems  Unable to perform ROS: Dementia  Cardiovascular: Positive for syncope.      Allergies  Shellfish allergy; Demerol; and Flagyl  Home Medications   Prior to Admission medications   Medication Sig Start Date End Date Taking? Authorizing Provider  amLODipine (NORVASC) 10 MG tablet Take 10 mg by mouth daily.  01/18/14  Yes Historical Provider, MD  aspirin EC 81 MG tablet Take 81 mg by mouth daily.   Yes Historical Provider, MD  atorvastatin (LIPITOR) 20 MG tablet Take 20 mg by mouth daily.   Yes Historical Provider, MD  cetirizine (ZYRTEC) 10 MG tablet Take 10 mg by mouth daily as needed for allergies. As needed   Yes Historical Provider, MD  cholecalciferol (VITAMIN D) 400 UNITS TABS tablet Take 400 Units by mouth daily.    Yes Historical Provider, MD  co-enzyme Q-10 30 MG capsule Take 30 mg by mouth daily.   Yes Historical Provider, MD  donepezil (ARICEPT) 23 MG TABS tablet Take 23 mg by mouth at bedtime.  11/30/13  Yes Historical Provider, MD  escitalopram (LEXAPRO) 10 MG tablet  TAKE 1 TABLET DAILY 01/19/15  Yes Star Age, MD  fosinopril (MONOPRIL) 40 MG tablet Take 40 mg by mouth daily.   Yes Historical Provider, MD  levothyroxine (SYNTHROID, LEVOTHROID) 75 MCG tablet Take 75 mcg by mouth daily.   Yes Historical Provider, MD  Melatonin 5 MG TABS Take 5 mg by mouth at bedtime as needed (sleep).   Yes Historical Provider, MD  Memantine HCl ER (NAMENDA XR) 28 MG CP24 Take 1 capsule by mouth daily.   Yes Historical Provider, MD  metFORMIN (GLUCOPHAGE) 1000 MG tablet Take 1,000 mg by mouth daily with breakfast.  07/07/14  Yes Historical Provider, MD  vitamin E (VITAMIN E) 1000 UNIT capsule Take 2,000 Units by mouth daily.   Yes Historical Provider, MD   BP 126/68 mmHg  Pulse 67  Resp 15  SpO2 95% Physical Exam  Constitutional: She appears well-developed  and well-nourished.  HENT:  Head: Normocephalic.  Posterior scalp with a 2 cm laceration. Bleeding controlled. No step off.  Eyes: Conjunctivae and EOM are normal. Pupils are equal, round, and reactive to light.  Neck:  Cervical collar in place.  Cardiovascular: Normal rate, regular rhythm and normal heart sounds.   Pulmonary/Chest: Effort normal and breath sounds normal. No respiratory distress.  Abdominal: Soft. There is no tenderness.  Musculoskeletal: Normal range of motion. She exhibits no tenderness.  Neurological: She is alert. No cranial nerve deficit. She exhibits normal muscle tone. Coordination normal.  Skin: Skin is warm.  Nursing note and vitals reviewed.   ED Course  Procedures (including critical care time) Labs Review Labs Reviewed  COMPREHENSIVE METABOLIC PANEL - Abnormal; Notable for the following:    Potassium 3.2 (*)    Glucose, Bld 155 (*)    Creatinine, Ser 1.09 (*)    ALT 12 (*)    GFR calc non Af Amer 51 (*)    GFR calc Af Amer 59 (*)    All other components within normal limits  CBG MONITORING, ED - Abnormal; Notable for the following:    Glucose-Capillary 147 (*)    All other components within normal limits  CBC  URINALYSIS, ROUTINE W REFLEX MICROSCOPIC (NOT AT [facility])   Results for orders placed or performed during the hospital encounter of 01/25/15  CBC  Result Value Ref Range   WBC 5.2 4.0 - 10.5 K/uL   RBC 4.37 3.87 - 5.11 MIL/uL   Hemoglobin 13.3 12.0 - 15.0 g/dL   HCT 39.9 36.0 - 46.0 %   MCV 91.3 78.0 - 100.0 fL   MCH 30.4 26.0 - 34.0 pg   MCHC 33.3 30.0 - 36.0 g/dL   RDW 12.4 11.5 - 15.5 %   Platelets 229 150 - 400 K/uL  Comprehensive metabolic panel  Result Value Ref Range   Sodium 140 135 - 145 mmol/L   Potassium 3.2 (L) 3.5 - 5.1 mmol/L   Chloride 103 101 - 111 mmol/L   CO2 27 22 - 32 mmol/L   Glucose, Bld 155 (H) 65 - 99 mg/dL   BUN 11 6 - 20 mg/dL   Creatinine, Ser 1.09 (H) 0.44 - 1.00 mg/dL   Calcium 9.1 8.9 - 10.3 mg/dL    Total Protein 6.9 6.5 - 8.1 g/dL   Albumin 3.9 3.5 - 5.0 g/dL   AST 19 15 - 41 U/L   ALT 12 (L) 14 - 54 U/L   Alkaline Phosphatase 57 38 - 126 U/L   Total Bilirubin 0.5 0.3 - 1.2 mg/dL  GFR calc non Af Amer 51 (L) >60 mL/min   GFR calc Af Amer 59 (L) >60 mL/min   Anion gap 10 5 - 15  CBG monitoring, ED  Result Value Ref Range   Glucose-Capillary 147 (H) 65 - 99 mg/dL     Imaging Review Ct Head Wo Contrast  01/25/2015  CLINICAL DATA:  70 year old female with acute syncope, small and head and neck injury today. Initial encounter. EXAM: CT HEAD WITHOUT CONTRAST CT CERVICAL SPINE WITHOUT CONTRAST TECHNIQUE: Multidetector CT imaging of the head and cervical spine was performed following the standard protocol without intravenous contrast. Multiplanar CT image reconstructions of the cervical spine were also generated. COMPARISON:  02/10/2012 and prior MRs FINDINGS: CT HEAD FINDINGS Mild atrophy and chronic small-vessel white matter ischemic changes again noted. No acute intracranial abnormalities are identified, including mass lesion or mass effect, hydrocephalus, extra-axial fluid collection, midline shift, hemorrhage, or acute infarction. The visualized bony calvarium is unremarkable. Right posterior scalp soft tissue swelling is identified. CT CERVICAL SPINE FINDINGS Straightening of the normal cervical lordosis is noted. There is no evidence of acute fracture, subluxation or prevertebral soft tissue swelling. The disc spaces are maintained. No focal bony lesions are identified. The soft tissue structures are unremarkable. IMPRESSION: Right posterior scalp soft tissue swelling without fracture. No evidence of acute intracranial abnormality. Mild atrophy and chronic small-vessel white matter ischemic changes. No static evidence of acute injury to cervical spine. Electronically Signed   By: Margarette Canada M.D.   On: 01/25/2015 17:19   Ct Cervical Spine Wo Contrast  01/25/2015  CLINICAL DATA:  70 year old  female with acute syncope, small and head and neck injury today. Initial encounter. EXAM: CT HEAD WITHOUT CONTRAST CT CERVICAL SPINE WITHOUT CONTRAST TECHNIQUE: Multidetector CT imaging of the head and cervical spine was performed following the standard protocol without intravenous contrast. Multiplanar CT image reconstructions of the cervical spine were also generated. COMPARISON:  02/10/2012 and prior MRs FINDINGS: CT HEAD FINDINGS Mild atrophy and chronic small-vessel white matter ischemic changes again noted. No acute intracranial abnormalities are identified, including mass lesion or mass effect, hydrocephalus, extra-axial fluid collection, midline shift, hemorrhage, or acute infarction. The visualized bony calvarium is unremarkable. Right posterior scalp soft tissue swelling is identified. CT CERVICAL SPINE FINDINGS Straightening of the normal cervical lordosis is noted. There is no evidence of acute fracture, subluxation or prevertebral soft tissue swelling. The disc spaces are maintained. No focal bony lesions are identified. The soft tissue structures are unremarkable. IMPRESSION: Right posterior scalp soft tissue swelling without fracture. No evidence of acute intracranial abnormality. Mild atrophy and chronic small-vessel white matter ischemic changes. No static evidence of acute injury to cervical spine. Electronically Signed   By: Margarette Canada M.D.   On: 01/25/2015 17:19   Dg Hips Bilat With Pelvis 3-4 Views  01/25/2015  CLINICAL DATA:  Fall at grocery store.  Initial encounter. EXAM: DG HIP (WITH OR WITHOUT PELVIS) 3-4V BILAT COMPARISON:  None. FINDINGS: There is no evidence of hip fracture or dislocation. There is no evidence of arthropathy or other focal bone abnormality. IMPRESSION: Normal bilateral hips. Electronically Signed   By: Marijo Conception, M.D.   On: 01/25/2015 16:55   I have personally reviewed and evaluated these images and lab results as part of my medical decision-making.   EKG  Interpretation   Date/Time:  Thursday January 25 2015 15:55:51 EST Ventricular Rate:  70 PR Interval:  172 QRS Duration: 97 QT Interval:  430 QTC Calculation:  464 R Axis:   53 Text Interpretation:  Sinus rhythm Abnormal R-wave progression, early  transition Minimal ST depression, anterior leads Artifact Confirmed by  Delanna Blacketer  MD, Bruna Dills [phone]) on 01/25/2015 4:27:38 PM       LACERATION REPAIR Performed by: Fredia Sorrow Authorized by: Fredia Sorrow Consent: Verbal consent obtained. Risks and benefits: risks, benefits and alternatives were discussed Consent given by: patient Patient identity confirmed: provided demographic data Prepped and Draped in normal sterile fashion Wound explored  Laceration Location: Posterior scalp  Laceration Length:  2 cm  No Foreign Bodies seen or palpated  Anesthesia: local infiltration  Local anesthetic:   Anesthetic total: 0  Irrigation method: syringe Amount of cleaning: standard  Skin closure: Surgical staples 3   Number of sutures: staples 3 Technique: staples 3   Patient tolerance: Patient tolerated the procedure well with no immediate complications.    Posterior scalp MDM   Final diagnoses:  Near syncope  Fall, initial encounter  Scalp laceration, initial encounter    Patient status post [place] the food [place]. Caregiver with patient was a witnessed fall no loss of consciousness did not black out. Patient did have a dizzy spell but did not lose consciousness. Therefore this appears to be a near syncopal episode. Workup for injuries from the fall without any acute findings. CT head and neck without any acute abnormalities. X-rays of both hips and pelvis without any evidence of fracture.  Patient did sustain a 2 cm laceration to the occiput of the scalp. Was closed with 3 surgical staples.  Wound care and follow-up for staple removal.  Patient's workup for the near-syncopal episode EKG without acute findings.  Electrolytes and CBC without significant abnormalities. Cardiac monitoring here showed no evidence of arrhythmia.    Fredia Sorrow, MD 01/25/15 1803  Fredia Sorrow, MD 01/25/15 1806

## 2015-01-25 NOTE — ED Notes (Signed)
Cleaned pt's head laceration with wound cleanser per MD Zackowski. Pt tolerated well. Pt noted to have approx a 1 in laceration to RT posterior head.

## 2015-01-25 NOTE — ED Notes (Signed)
Phlebotomy at bedside.

## 2015-01-25 NOTE — ED Notes (Signed)
MD Zackowski at bedside. 

## 2015-02-02 DIAGNOSIS — Z4802 Encounter for removal of sutures: Secondary | ICD-10-CM | POA: Diagnosis not present

## 2015-03-13 DIAGNOSIS — G309 Alzheimer's disease, unspecified: Secondary | ICD-10-CM | POA: Diagnosis not present

## 2015-03-13 DIAGNOSIS — F325 Major depressive disorder, single episode, in full remission: Secondary | ICD-10-CM | POA: Diagnosis not present

## 2015-03-13 DIAGNOSIS — Z7984 Long term (current) use of oral hypoglycemic drugs: Secondary | ICD-10-CM | POA: Diagnosis not present

## 2015-03-13 DIAGNOSIS — E039 Hypothyroidism, unspecified: Secondary | ICD-10-CM | POA: Diagnosis not present

## 2015-03-13 DIAGNOSIS — Z79899 Other long term (current) drug therapy: Secondary | ICD-10-CM | POA: Diagnosis not present

## 2015-03-13 DIAGNOSIS — E119 Type 2 diabetes mellitus without complications: Secondary | ICD-10-CM | POA: Diagnosis not present

## 2015-03-13 DIAGNOSIS — I10 Essential (primary) hypertension: Secondary | ICD-10-CM | POA: Diagnosis not present

## 2015-03-13 DIAGNOSIS — I8310 Varicose veins of unspecified lower extremity with inflammation: Secondary | ICD-10-CM | POA: Diagnosis not present

## 2015-03-13 DIAGNOSIS — E7800 Pure hypercholesterolemia, unspecified: Secondary | ICD-10-CM | POA: Diagnosis not present

## 2015-04-27 ENCOUNTER — Ambulatory Visit (INDEPENDENT_AMBULATORY_CARE_PROVIDER_SITE_OTHER): Payer: Medicare Other | Admitting: Podiatry

## 2015-04-27 ENCOUNTER — Encounter: Payer: Self-pay | Admitting: Podiatry

## 2015-04-27 DIAGNOSIS — B351 Tinea unguium: Secondary | ICD-10-CM | POA: Diagnosis not present

## 2015-04-27 DIAGNOSIS — M79676 Pain in unspecified toe(s): Secondary | ICD-10-CM | POA: Diagnosis not present

## 2015-04-27 DIAGNOSIS — E119 Type 2 diabetes mellitus without complications: Secondary | ICD-10-CM

## 2015-04-27 DIAGNOSIS — L603 Nail dystrophy: Secondary | ICD-10-CM

## 2015-04-27 NOTE — Progress Notes (Signed)
Patient ID: Frances [name], female   DOB: [birth year], 70 y.o.   MRN: [id number] Complaint:  Visit Type: Patient returns to my office for continued preventative foot care services. Complaint: Patient states" my nails have grown long and thick big toenail left foot. and become painful to walk and wear shoes" Patient has been diagnosed with DM with no foot complications. The patient presents for preventative foot care services. No changes to ROS  Podiatric Exam: Vascular: dorsalis pedis and posterior tibial pulses are palpable bilateral. Capillary return is immediate. Temperature gradient is WNL. Skin turgor WNL  Sensorium: Normal Semmes Weinstein monofilament test. Normal tactile sensation bilaterally. Nail Exam: Pt has thick disfigured discolored left hallux toenail.  Dystropic and brittle nail. Ulcer Exam: There is no evidence of ulcer or pre-ulcerative changes or infection. Orthopedic Exam: Muscle tone and strength are WNL. No limitations in general ROM. No crepitus or effusions noted. Foot type and digits show no abnormalities. Bony prominences are unremarkable. Skin: No Porokeratosis. No infection or ulcers  Diagnosis:  Onychomycosis left hallux  Treatment & Plan Procedures and Treatment: Consent by patient was obtained for treatment procedures. The patient understood the discussion of treatment and procedures well. All questions were answered thoroughly reviewed. Debridement of mycotic and hypertrophic toenails, 1 through 5 bilateral and clearing of subungual debris. No ulceration, no infection noted.  Return Visit-Office Procedure: Patient instructed to return to the office for a follow up visit 3 months for continued evaluation and treatment.    Gardiner Barefoot DPM

## 2015-07-18 ENCOUNTER — Other Ambulatory Visit: Payer: Self-pay | Admitting: Neurology

## 2015-07-27 ENCOUNTER — Encounter: Payer: Self-pay | Admitting: Podiatry

## 2015-07-27 ENCOUNTER — Ambulatory Visit (INDEPENDENT_AMBULATORY_CARE_PROVIDER_SITE_OTHER): Payer: Medicare Other | Admitting: Podiatry

## 2015-07-27 DIAGNOSIS — M79676 Pain in unspecified toe(s): Secondary | ICD-10-CM

## 2015-07-27 DIAGNOSIS — B351 Tinea unguium: Secondary | ICD-10-CM | POA: Diagnosis not present

## 2015-07-27 NOTE — Progress Notes (Signed)
Patient ID: Frances [name], female   DOB: [birth year], 70 y.o.   MRN: [id number] Complaint:  Visit Type: Patient returns to my office for continued preventative foot care services. Complaint: Patient states" my nails have grown long and thick big toenail left foot. and become painful to walk and wear shoes" Patient has been diagnosed with DM with no foot complications. The patient presents for preventative foot care services. No changes to ROS  Podiatric Exam: Vascular: dorsalis pedis and posterior tibial pulses are palpable bilateral. Capillary return is immediate. Temperature gradient is WNL. Skin turgor WNL  Sensorium: Normal Semmes Weinstein monofilament test. Normal tactile sensation bilaterally. Nail Exam: Pt has thick disfigured discolored left hallux toenail.  Dystropic and brittle nail. Ulcer Exam: There is no evidence of ulcer or pre-ulcerative changes or infection. Orthopedic Exam: Muscle tone and strength are WNL. No limitations in general ROM. No crepitus or effusions noted. Foot type and digits show no abnormalities. Bony prominences are unremarkable. Skin: No Porokeratosis. No infection or ulcers  Diagnosis:  Onychomycosis left hallux  Treatment & Plan Procedures and Treatment: Consent by patient was obtained for treatment procedures. The patient understood the discussion of treatment and procedures well. All questions were answered thoroughly reviewed. Debridement of mycotic and hypertrophic toenails, 1 through 5 bilateral and clearing of subungual debris. No ulceration, no infection noted.  Return Visit-Office Procedure: Patient instructed to return to the office for a follow up visit 3 months for continued evaluation and treatment.    Gardiner Barefoot DPM

## 2015-08-23 ENCOUNTER — Encounter (HOSPITAL_COMMUNITY): Payer: Self-pay | Admitting: *Deleted

## 2015-08-23 ENCOUNTER — Inpatient Hospital Stay (HOSPITAL_COMMUNITY): Payer: Medicare Other

## 2015-08-23 ENCOUNTER — Emergency Department (HOSPITAL_COMMUNITY): Payer: Medicare Other

## 2015-08-23 ENCOUNTER — Inpatient Hospital Stay (HOSPITAL_COMMUNITY)
Admission: EM | Admit: 2015-08-23 | Discharge: 2015-08-28 | DRG: 872 | Disposition: A | Discharge status: Home / Self Care | Payer: Medicare Other | Attending: Internal Medicine | Admitting: Internal Medicine

## 2015-08-23 DIAGNOSIS — N281 Cyst of kidney, acquired: Secondary | ICD-10-CM | POA: Diagnosis not present

## 2015-08-23 DIAGNOSIS — N390 Urinary tract infection, site not specified: Secondary | ICD-10-CM | POA: Diagnosis not present

## 2015-08-23 DIAGNOSIS — Z7984 Long term (current) use of oral hypoglycemic drugs: Secondary | ICD-10-CM

## 2015-08-23 DIAGNOSIS — Z833 Family history of diabetes mellitus: Secondary | ICD-10-CM

## 2015-08-23 DIAGNOSIS — E876 Hypokalemia: Secondary | ICD-10-CM | POA: Diagnosis present

## 2015-08-23 DIAGNOSIS — F329 Major depressive disorder, single episode, unspecified: Secondary | ICD-10-CM | POA: Diagnosis present

## 2015-08-23 DIAGNOSIS — Z8249 Family history of ischemic heart disease and other diseases of the circulatory system: Secondary | ICD-10-CM

## 2015-08-23 DIAGNOSIS — F0280 Dementia in other diseases classified elsewhere without behavioral disturbance: Secondary | ICD-10-CM | POA: Diagnosis present

## 2015-08-23 DIAGNOSIS — D72829 Elevated white blood cell count, unspecified: Secondary | ICD-10-CM

## 2015-08-23 DIAGNOSIS — Z803 Family history of malignant neoplasm of breast: Secondary | ICD-10-CM

## 2015-08-23 DIAGNOSIS — Z91013 Allergy to seafood: Secondary | ICD-10-CM | POA: Diagnosis not present

## 2015-08-23 DIAGNOSIS — Z90710 Acquired absence of both cervix and uterus: Secondary | ICD-10-CM

## 2015-08-23 DIAGNOSIS — R32 Unspecified urinary incontinence: Secondary | ICD-10-CM | POA: Diagnosis present

## 2015-08-23 DIAGNOSIS — R5383 Other fatigue: Secondary | ICD-10-CM | POA: Diagnosis not present

## 2015-08-23 DIAGNOSIS — Z7982 Long term (current) use of aspirin: Secondary | ICD-10-CM | POA: Diagnosis not present

## 2015-08-23 DIAGNOSIS — Z79899 Other long term (current) drug therapy: Secondary | ICD-10-CM

## 2015-08-23 DIAGNOSIS — E872 Acidosis: Secondary | ICD-10-CM | POA: Diagnosis present

## 2015-08-23 DIAGNOSIS — R4182 Altered mental status, unspecified: Secondary | ICD-10-CM

## 2015-08-23 DIAGNOSIS — F0390 Unspecified dementia without behavioral disturbance: Secondary | ICD-10-CM | POA: Diagnosis not present

## 2015-08-23 DIAGNOSIS — R269 Unspecified abnormalities of gait and mobility: Secondary | ICD-10-CM | POA: Diagnosis not present

## 2015-08-23 DIAGNOSIS — G309 Alzheimer's disease, unspecified: Secondary | ICD-10-CM | POA: Diagnosis present

## 2015-08-23 DIAGNOSIS — R531 Weakness: Secondary | ICD-10-CM

## 2015-08-23 DIAGNOSIS — R402441 Other coma, without documented Glasgow coma scale score, or with partial score reported, in the field [EMT or ambulance]: Secondary | ICD-10-CM | POA: Diagnosis not present

## 2015-08-23 DIAGNOSIS — Z885 Allergy status to narcotic agent status: Secondary | ICD-10-CM | POA: Diagnosis not present

## 2015-08-23 DIAGNOSIS — A419 Sepsis, unspecified organism: Secondary | ICD-10-CM | POA: Diagnosis not present

## 2015-08-23 DIAGNOSIS — I10 Essential (primary) hypertension: Secondary | ICD-10-CM | POA: Diagnosis present

## 2015-08-23 DIAGNOSIS — E039 Hypothyroidism, unspecified: Secondary | ICD-10-CM | POA: Diagnosis present

## 2015-08-23 DIAGNOSIS — R509 Fever, unspecified: Secondary | ICD-10-CM | POA: Diagnosis not present

## 2015-08-23 DIAGNOSIS — E119 Type 2 diabetes mellitus without complications: Secondary | ICD-10-CM | POA: Diagnosis not present

## 2015-08-23 DIAGNOSIS — Z823 Family history of stroke: Secondary | ICD-10-CM | POA: Diagnosis not present

## 2015-08-23 DIAGNOSIS — R7881 Bacteremia: Secondary | ICD-10-CM | POA: Diagnosis not present

## 2015-08-23 DIAGNOSIS — A4151 Sepsis due to Escherichia coli [E. coli]: Principal | ICD-10-CM | POA: Diagnosis present

## 2015-08-23 DIAGNOSIS — Z881 Allergy status to other antibiotic agents status: Secondary | ICD-10-CM

## 2015-08-23 LAB — URINE MICROSCOPIC-ADD ON

## 2015-08-23 LAB — I-STAT TROPONIN, ED: TROPONIN I, POC: 0 ng/mL (ref 0.00–0.08)

## 2015-08-23 LAB — CBC WITH DIFFERENTIAL/PLATELET
BASOS PCT: 0 %
Basophils Absolute: 0 10*3/uL (ref 0.0–0.1)
EOS ABS: 0 10*3/uL (ref 0.0–0.7)
Eosinophils Relative: 0 %
HCT: 42 % (ref 36.0–46.0)
Hemoglobin: 14.1 g/dL (ref 12.0–15.0)
LYMPHS ABS: 0.8 10*3/uL (ref 0.7–4.0)
Lymphocytes Relative: 7 %
MCH: 30.5 pg (ref 26.0–34.0)
MCHC: 33.6 g/dL (ref 30.0–36.0)
MCV: 90.7 fL (ref 78.0–100.0)
MONO ABS: 1 10*3/uL (ref 0.1–1.0)
MONOS PCT: 8 %
Neutro Abs: 9.9 10*3/uL — ABNORMAL HIGH (ref 1.7–7.7)
Neutrophils Relative %: 85 %
Platelets: 198 10*3/uL (ref 150–400)
RBC: 4.63 MIL/uL (ref 3.87–5.11)
RDW: 13.1 % (ref 11.5–15.5)
WBC: 11.7 10*3/uL — ABNORMAL HIGH (ref 4.0–10.5)

## 2015-08-23 LAB — URINALYSIS, ROUTINE W REFLEX MICROSCOPIC
BILIRUBIN URINE: NEGATIVE
Glucose, UA: NEGATIVE mg/dL
Ketones, ur: NEGATIVE mg/dL
NITRITE: POSITIVE — AB
PH: 5 (ref 5.0–8.0)
Protein, ur: NEGATIVE mg/dL
SPECIFIC GRAVITY, URINE: 1.017 (ref 1.005–1.030)

## 2015-08-23 LAB — COMPREHENSIVE METABOLIC PANEL
ALBUMIN: 4.2 g/dL (ref 3.5–5.0)
ALK PHOS: 65 U/L (ref 38–126)
ALT: 11 U/L — AB (ref 14–54)
AST: 14 U/L — AB (ref 15–41)
Anion gap: 10 (ref 5–15)
BUN: 13 mg/dL (ref 6–20)
CALCIUM: 9.1 mg/dL (ref 8.9–10.3)
CHLORIDE: 96 mmol/L — AB (ref 101–111)
CO2: 26 mmol/L (ref 22–32)
CREATININE: 0.98 mg/dL (ref 0.44–1.00)
GFR calc Af Amer: >60 mL/min (ref >60)
GFR calc non Af Amer: 57 mL/min — ABNORMAL LOW (ref >60)
GLUCOSE: 154 mg/dL — AB (ref 65–99)
Potassium: 3.6 mmol/L (ref 3.5–5.1)
SODIUM: 132 mmol/L — AB (ref 135–145)
Total Bilirubin: 1.7 mg/dL — ABNORMAL HIGH (ref 0.3–1.2)
Total Protein: 8.2 g/dL — ABNORMAL HIGH (ref 6.5–8.1)

## 2015-08-23 LAB — I-STAT CG4 LACTIC ACID, ED
Lactic Acid, Venous: 2 mmol/L (ref 0.5–1.9)
Lactic Acid, Venous: 1.45 mmol/L (ref 0.5–1.9)

## 2015-08-23 MED ORDER — ACETAMINOPHEN 325 MG PO TABS
650.0000 mg | ORAL_TABLET | Freq: Once | ORAL | Status: AC
  Administered 2015-08-23: 650 mg via ORAL
  Filled 2015-08-23: qty 2

## 2015-08-23 MED ORDER — PIPERACILLIN-TAZOBACTAM 3.375 G IVPB 30 MIN
3.3750 g | Freq: Once | INTRAVENOUS | Status: AC
  Administered 2015-08-23: 3.375 g via INTRAVENOUS
  Filled 2015-08-23: qty 50

## 2015-08-23 MED ORDER — SODIUM CHLORIDE 0.9 % IV BOLUS (SEPSIS)
1000.0000 mL | Freq: Once | INTRAVENOUS | Status: AC
  Administered 2015-08-23: 1000 mL via INTRAVENOUS

## 2015-08-23 MED ORDER — VANCOMYCIN HCL 500 MG IV SOLR: 500.0000 mg | Freq: Two times a day (BID) | INTRAVENOUS | Status: DC

## 2015-08-23 MED ORDER — SODIUM CHLORIDE 0.9 % IV SOLN
1000.0000 mL | INTRAVENOUS | Status: DC
  Administered 2015-08-23 – 2015-08-25 (×5): 1000 mL via INTRAVENOUS

## 2015-08-23 MED ORDER — SODIUM CHLORIDE 0.9 % IV BOLUS (SEPSIS)
250.0000 mL | Freq: Once | INTRAVENOUS | Status: AC
  Administered 2015-08-23: 250 mL via INTRAVENOUS

## 2015-08-23 MED ORDER — VANCOMYCIN HCL IN DEXTROSE 1-5 GM/200ML-% IV SOLN
1000.0000 mg | Freq: Once | INTRAVENOUS | Status: AC
  Administered 2015-08-23: 1000 mg via INTRAVENOUS
  Filled 2015-08-23: qty 200

## 2015-08-23 MED ORDER — PIPERACILLIN-TAZOBACTAM 3.375 G IVPB: 3.3750 g | Freq: Three times a day (TID) | INTRAVENOUS | Status: DC

## 2015-08-23 NOTE — ED Triage Notes (Signed)
Per EMS - patient comes from home where she lives with her family.  Patient's family called EMS b/c patient is typically ambulatory, but past 2 days she has been increasingly less active.  Patient was seen at PCP office yesterday and a UTI was suspected, but they were unable to obtain a sample.  Patient's family also report decreased appetite.  Patient has hx of dementia.  Patient is verbal, but intermittently so at baseline.  Patient's vitals 128/68, HR 100, CBG 180.

## 2015-08-23 NOTE — ED Notes (Signed)
Bed: WA08 Expected date:  Expected time:  Means of arrival:  Comments: EMS 

## 2015-08-23 NOTE — ED Notes (Signed)
Pt responds to her name and is able to respond to yes or no questions, but is not able to elaborate further

## 2015-08-23 NOTE — ED Provider Notes (Signed)
[facility] DEPT Provider Note   CSN: [id number] Arrival date & time: 08/23/15  1844     History   Chief Complaint Chief Complaint  Patient presents with  . Altered Mental Status    HPI Frances [name] is a 70 y.o. female.  HPI Frances [name] is a 70 y.o. female with history of dementia, diabetes, hypertension, thyroid disease, depression, presents to emergency department complaining of progressive weakness and confusion. Patient's symptoms for the last several days. Patient's family at bedside, state that she has gotten so weak, she is unable to get out of bed today. Denies any respiratory symptoms, nausea, vomiting, diarrhea. She went to a family doctor yesterday, they suspected UTI, but they were unable to obtain urine sample. Pt with decreased activity and appetite. No treatment prior to coming in.   Past Medical History:  Diagnosis Date  . Cystocele   . Dementia   . Depressed   . Diabetes mellitus   . Endometriosis   . Hypertension   . Rectocele   . Thyroid disease   . Urinary incontinence    USI    Patient Active Problem List   Diagnosis Date Noted  . Urinary incontinence   . Cystocele   . Rectocele   . Thyroid disease   . Endometriosis   . Dementia   . Diabetes mellitus     Past Surgical History:  Procedure Laterality Date  . basal cell excised    . BLADDER SUSPENSION     A/P Repair-Sling  . HEMORRHOID SURGERY    . KNEE SURGERY    . OOPHORECTOMY  1992   BSO  . TONSILLECTOMY    . VAGINAL HYSTERECTOMY  1992   Vag Hyst BSO    OB History    Gravida Para Term Preterm AB Living   0             SAB TAB Ectopic Multiple Live Births                   Home Medications    Prior to Admission medications   Medication Sig Start Date End Date Taking? Authorizing Provider  amLODipine (NORVASC) 10 MG tablet Take 10 mg by mouth daily.  01/18/14   Historical Provider, MD  aspirin EC 81 MG tablet Take 81 mg by mouth daily.    Historical Provider, MD   atorvastatin (LIPITOR) 20 MG tablet Take 20 mg by mouth daily.    Historical Provider, MD  cetirizine (ZYRTEC) 10 MG tablet Take 10 mg by mouth daily as needed for allergies. As needed    Historical Provider, MD  cholecalciferol (VITAMIN D) 400 UNITS TABS tablet Take 400 Units by mouth daily.     Historical Provider, MD  co-enzyme Q-10 30 MG capsule Take 30 mg by mouth daily.    Historical Provider, MD  donepezil (ARICEPT) 23 MG TABS tablet Take 23 mg by mouth at bedtime.  11/30/13   Historical Provider, MD  escitalopram (LEXAPRO) 10 MG tablet TAKE 1 TABLET DAILY 07/18/15   Star Age, MD  fosinopril (MONOPRIL) 40 MG tablet Take 40 mg by mouth daily.    Historical Provider, MD  levothyroxine (SYNTHROID, LEVOTHROID) 75 MCG tablet Take 75 mcg by mouth daily.    Historical Provider, MD  Melatonin 5 MG TABS Take 5 mg by mouth at bedtime as needed (sleep).    Historical Provider, MD  Memantine HCl ER (NAMENDA XR) 28 MG CP24 Take 1 capsule by mouth daily.  Historical Provider, MD  metFORMIN (GLUCOPHAGE) 1000 MG tablet Take 1,000 mg by mouth daily with breakfast.  07/07/14   Historical Provider, MD  vitamin E (VITAMIN E) 1000 UNIT capsule Take 2,000 Units by mouth daily.    Historical Provider, MD    Family History Family History  Problem Relation Age of Onset  . Hypertension Mother   . Stroke Mother   . Heart failure Mother   . Hypertension Father   . Diabetes Sister   . Hypertension Sister   . Breast cancer Sister   . Heart disease Sister     Social History Social History  Substance Use Topics  . Smoking status: Never Smoker  . Smokeless tobacco: Never Used  . Alcohol use No     Allergies   Shellfish allergy; Demerol [meperidine]; and Flagyl [metronidazole]   Review of Systems Review of Systems  Unable to perform ROS: Dementia  Skin: Negative for rash.  All other systems reviewed and are negative.    Physical Exam Updated Vital Signs BP 138/74 (BP Location: Left Arm)    Pulse 103   Temp 102.4 F (39.1 C) (Rectal)   Resp 18   SpO2 97%   Physical Exam  Constitutional: She appears well-developed and well-nourished. No distress.  HENT:  Head: Normocephalic.  Eyes: Conjunctivae are normal.  Neck: Neck supple.  Cardiovascular: Normal rate, regular rhythm and normal heart sounds.   Pulmonary/Chest: Effort normal and breath sounds normal. No respiratory distress. She has no wheezes. She has no rales.  Abdominal: Soft. Bowel sounds are normal. She exhibits no distension. There is no tenderness. There is no rebound.  Musculoskeletal: She exhibits no edema.  Neurological: She is alert.  Disoriented x3 but does recognize her family. Able to answer few simple questions yes and no. 5/5 and equal upper and lower extremity strength bilaterally. Equal grip strength bilaterally.no pronator drift  Skin: Skin is warm and dry. Capillary refill takes less than 2 seconds.  Psychiatric: She has a normal mood and affect. Her behavior is normal.  Nursing note and vitals reviewed.    ED Treatments / Results  Labs (all labs ordered are listed, but only abnormal results are displayed) Labs Reviewed  COMPREHENSIVE METABOLIC PANEL - Abnormal; Notable for the following:       Result Value   Sodium 132 (*)    Chloride 96 (*)    Glucose, Bld 154 (*)    Total Protein 8.2 (*)    AST 14 (*)    ALT 11 (*)    Total Bilirubin 1.7 (*)    GFR calc non Af Amer 57 (*)    All other components within normal limits  CBC WITH DIFFERENTIAL/PLATELET - Abnormal; Notable for the following:    WBC 11.7 (*)    Neutro Abs 9.9 (*)    All other components within normal limits  URINALYSIS, ROUTINE W REFLEX MICROSCOPIC (NOT AT [facility]) - Abnormal; Notable for the following:    APPearance CLOUDY (*)    Hgb urine dipstick SMALL (*)    Nitrite POSITIVE (*)    Leukocytes, UA SMALL (*)    All other components within normal limits  URINE MICROSCOPIC-ADD ON - Abnormal; Notable for the following:     Squamous Epithelial / LPF 0-5 (*)    Bacteria, UA MANY (*)    All other components within normal limits  I-STAT CG4 LACTIC ACID, ED - Abnormal; Notable for the following:    Lactic Acid, Venous 2.00 (*)  All other components within normal limits  CULTURE, BLOOD (ROUTINE X 2)  CULTURE, BLOOD (ROUTINE X 2)  URINE CULTURE  LACTIC ACID, PLASMA  PROCALCITONIN  PROTIME-INR  APTT  LACTIC ACID, PLASMA  HEMOGLOBIN A1C  CBC  BASIC METABOLIC PANEL  I-STAT TROPOININ, ED  I-STAT CG4 LACTIC ACID, ED    EKG  EKG Interpretation None       Radiology Dg Chest 2 View  Result Date: 08/23/2015 CLINICAL DATA:  Sepsis. EXAM: CHEST  2 VIEW COMPARISON:  04/18/2009 FINDINGS: Two views of the chest demonstrate slightly low lung volumes. No focal airspace disease or pulmonary edema. Heart and mediastinum are within normal limits. Mild degenerative changes in the thoracic spine. No large pleural effusions. IMPRESSION: Low lung volumes without focal disease. Electronically Signed   By: Markus Daft M.D.   On: 08/23/2015 20:05   Ct Head Wo Contrast  Result Date: 08/24/2015 CLINICAL DATA:  70 y/o F; decreased appetite and 2 days of increasingly less activity with history of dementia. Oral EXAM: CT HEAD WITHOUT CONTRAST TECHNIQUE: Contiguous axial images were obtained from the base of the skull through the vertex without intravenous contrast. COMPARISON:  CT of head dated 01/25/2015. FINDINGS: Brain: No evidence of acute infarction, hemorrhage, hydrocephalus, extra-axial collection or mass lesion/mass effect. Stable mild parenchymal volume loss and chronic microvascular ischemic changes. Vascular: No hyperdense vessel or unexpected calcification. Skull: No displaced skull fracture. Sinuses/Orbits: No acute finding. Other: No unexpected finding. IMPRESSION: No acute intracranial abnormality is identified. Stable mild parenchymal volume loss and chronic microvascular ischemic changes. Electronically Signed   By:  Kristine Garbe M.D.   On: 08/24/2015 00:00    Procedures Procedures (including critical care time)  Medications Ordered in ED Medications  sodium chloride 0.9 % bolus 1,000 mL (not administered)    And  sodium chloride 0.9 % bolus 1,000 mL (not administered)    And  sodium chloride 0.9 % bolus 250 mL (not administered)  0.9 %  sodium chloride infusion (not administered)  piperacillin-tazobactam (ZOSYN) IVPB 3.375 g (not administered)  vancomycin (VANCOCIN) IVPB 1000 mg/200 mL premix (not administered)  acetaminophen (TYLENOL) tablet 650 mg (not administered)     Initial Impression / Assessment and Plan / ED Course  I have reviewed the triage vital signs and the nursing notes.  Pertinent labs & imaging results that were available during my care of the patient were reviewed by me and considered in my medical decision making (see chart for details).  Clinical Course    7:27 PM Pt seen and examined. Pt with progressive weakness over last several days, now unable to walk. No other symptoms or complaints. Demented, at baseline. Poor historian. Family suspect UTI.  Pt is tachcyardic, febrile at 102.4, meets sirs criteria. Sepsis orderset placed. Unknown source at this time.   Urine showing infection. Chest x-ray clear. Lactic acid is 2.0. Patient received IV fluids, 30 mL/kg. She already received antibiotics. Given Ultram mental status in setting of UTI, will admit. Discussed plan with family who agree.    patient's vital signs are normal. I discussed with hospitalist who will admit.    Final Clinical Impressions(s) / ED Diagnoses   Final diagnoses:  UTI (lower urinary tract infection)  Altered mental status, unspecified altered mental status type  Weakness    New Prescriptions Current Discharge Medication List       Jeannett Senior, PA-C 08/24/15 0149    Milton Ferguson, MD 08/24/15 1520

## 2015-08-23 NOTE — Progress Notes (Signed)
Pharmacy Antibiotic Note  Frances [name] is a 70 y.o. female admitted from home on 08/23/2015 with suspected sepsis.  She was seen at her PCP on 8/17 for suspected UTI, but has progressive weakness, decreased appetite.  Pharmacy has been consulted for Vancomycin and Zosyn dosing.  Lactic acid: 2  Plan:  Zosyn 3.375g IV Q8H infused over 4hrs.  Vancomycin 1g IV stat, then 500 mg IV q12h.  Measure Vanc trough at steady state.  Follow up renal fxn, culture results, and clinical course.    Height: 5\' 7"  (170.2 cm) Weight: 146 lb (66.2 kg) IBW/kg (Calculated) : 61.6  Temp (24hrs), Avg:101.3 F (38.5 C), Min:100.1 F (37.8 C), Max:102.4 F (39.1 C)   Recent Labs Lab 08/23/15 1956 08/23/15 2011  WBC 11.7*  --   CREATININE 0.98  --   LATICACIDVEN  --  2.00*    CrCl cannot be calculated (Unknown ideal weight.).    Allergies  Allergen Reactions  . Shellfish Allergy Anaphylaxis  . Demerol [Meperidine] Nausea And Vomiting  . Flagyl [Metronidazole] Other (See Comments)    unknown    Antimicrobials this admission: 8/17 Zosyn >>  8/17 Vancomycin >>   Dose adjustments this admission:   Microbiology results: 8/17 BCx: sent 8/17 UCx: sent  Thank you for allowing pharmacy to be a part of this patient's care.  Gretta Arab PharmD, BCPS Pager [phone] 08/23/2015 7:41 PM

## 2015-08-24 DIAGNOSIS — E119 Type 2 diabetes mellitus without complications: Secondary | ICD-10-CM

## 2015-08-24 DIAGNOSIS — R4182 Altered mental status, unspecified: Secondary | ICD-10-CM

## 2015-08-24 LAB — BASIC METABOLIC PANEL
Anion gap: 10 (ref 5–15)
BUN: 9 mg/dL (ref 6–20)
CHLORIDE: 104 mmol/L (ref 101–111)
CO2: 23 mmol/L (ref 22–32)
CREATININE: 0.77 mg/dL (ref 0.44–1.00)
Calcium: 8.2 mg/dL — ABNORMAL LOW (ref 8.9–10.3)
GFR calc non Af Amer: >60 mL/min (ref >60)
Glucose, Bld: 166 mg/dL — ABNORMAL HIGH (ref 65–99)
POTASSIUM: 3.2 mmol/L — AB (ref 3.5–5.1)
SODIUM: 137 mmol/L (ref 135–145)

## 2015-08-24 LAB — CBC
HCT: 42 % (ref 36.0–46.0)
HEMOGLOBIN: 14.1 g/dL (ref 12.0–15.0)
MCH: 30.2 pg (ref 26.0–34.0)
MCHC: 33.6 g/dL (ref 30.0–36.0)
MCV: 89.9 fL (ref 78.0–100.0)
Platelets: 148 10*3/uL — ABNORMAL LOW (ref 150–400)
RBC: 4.67 MIL/uL (ref 3.87–5.11)
RDW: 13 % (ref 11.5–15.5)
WBC: 9.1 10*3/uL (ref 4.0–10.5)

## 2015-08-24 LAB — PROTIME-INR
INR: 1.17
Prothrombin Time: 15 seconds (ref 11.4–15.2)

## 2015-08-24 LAB — GLUCOSE, CAPILLARY
GLUCOSE-CAPILLARY: 137 mg/dL — AB (ref 65–99)
GLUCOSE-CAPILLARY: 221 mg/dL — AB (ref 65–99)
GLUCOSE-CAPILLARY: 235 mg/dL — AB (ref 65–99)
GLUCOSE-CAPILLARY: 201 mg/dL — AB (ref 65–99)

## 2015-08-24 LAB — LACTIC ACID, PLASMA
LACTIC ACID, VENOUS: 1.9 mmol/L (ref 0.5–1.9)
Lactic Acid, Venous: 1 mmol/L (ref 0.5–1.9)
Lactic Acid, Venous: 2.3 mmol/L (ref 0.5–1.9)
Lactic Acid, Venous: 4.2 mmol/L (ref 0.5–1.9)

## 2015-08-24 LAB — APTT: APTT: 27 s (ref 24–36)

## 2015-08-24 LAB — PROCALCITONIN: Procalcitonin: 0.23 ng/mL

## 2015-08-24 MED ORDER — ASPIRIN EC 81 MG PO TBEC
81.0000 mg | DELAYED_RELEASE_TABLET | Freq: Every day | ORAL | Status: DC
  Administered 2015-08-24 – 2015-08-27 (×5): 81 mg via ORAL
  Filled 2015-08-24 (×5): qty 1

## 2015-08-24 MED ORDER — ONDANSETRON HCL 4 MG/2ML IJ SOLN: 4.0000 mg | Freq: Four times a day (QID) | INTRAMUSCULAR | Status: DC | PRN

## 2015-08-24 MED ORDER — ACETAMINOPHEN 325 MG PO TABS
650.0000 mg | ORAL_TABLET | ORAL | Status: DC | PRN
  Administered 2015-08-26 (×2): 650 mg via ORAL
  Filled 2015-08-24 (×2): qty 2

## 2015-08-24 MED ORDER — LEVOTHYROXINE SODIUM 50 MCG PO TABS
75.0000 ug | ORAL_TABLET | Freq: Every day | ORAL | Status: DC
  Administered 2015-08-24 – 2015-08-28 (×5): 75 ug via ORAL
  Filled 2015-08-24 (×5): qty 1

## 2015-08-24 MED ORDER — ACETAMINOPHEN 650 MG RE SUPP
650.0000 mg | Freq: Four times a day (QID) | RECTAL | Status: DC | PRN
  Administered 2015-08-24: 650 mg via RECTAL
  Filled 2015-08-24 (×2): qty 1

## 2015-08-24 MED ORDER — AMLODIPINE BESYLATE 10 MG PO TABS
10.0000 mg | ORAL_TABLET | Freq: Every day | ORAL | Status: DC
  Administered 2015-08-24 – 2015-08-28 (×5): 10 mg via ORAL
  Filled 2015-08-24 (×5): qty 1

## 2015-08-24 MED ORDER — SODIUM CHLORIDE 0.9% FLUSH
3.0000 mL | Freq: Two times a day (BID) | INTRAVENOUS | Status: DC
  Administered 2015-08-24: 3 mL via INTRAVENOUS

## 2015-08-24 MED ORDER — IBUPROFEN 200 MG PO TABS
400.0000 mg | ORAL_TABLET | Freq: Once | ORAL | Status: AC
  Administered 2015-08-24: 400 mg via ORAL
  Filled 2015-08-24: qty 2

## 2015-08-24 MED ORDER — ACETAMINOPHEN 325 MG PO TABS: 650.0000 mg | ORAL_TABLET | Freq: Four times a day (QID) | ORAL | Status: DC | PRN

## 2015-08-24 MED ORDER — MEMANTINE HCL ER 28 MG PO CP24
28.0000 mg | ORAL_CAPSULE | Freq: Every day | ORAL | Status: DC
  Administered 2015-08-24 – 2015-08-28 (×5): 28 mg via ORAL
  Filled 2015-08-24 (×5): qty 1

## 2015-08-24 MED ORDER — SODIUM CHLORIDE 0.9 % IV BOLUS (SEPSIS)
500.0000 mL | Freq: Once | INTRAVENOUS | Status: AC
  Administered 2015-08-24: 500 mL via INTRAVENOUS

## 2015-08-24 MED ORDER — DEXTROSE 5 % IV SOLN
2.0000 g | INTRAVENOUS | Status: DC
  Filled 2015-08-24: qty 2

## 2015-08-24 MED ORDER — ACETAMINOPHEN 650 MG RE SUPP
650.0000 mg | RECTAL | Status: DC | PRN
  Administered 2015-08-24 – 2015-08-25 (×5): 650 mg via RECTAL
  Filled 2015-08-24 (×4): qty 1

## 2015-08-24 MED ORDER — DEXTROSE 5 % IV SOLN
2.0000 g | Freq: Once | INTRAVENOUS | Status: AC
  Administered 2015-08-24: 2 g via INTRAVENOUS
  Filled 2015-08-24: qty 2

## 2015-08-24 MED ORDER — ESCITALOPRAM OXALATE 10 MG PO TABS
10.0000 mg | ORAL_TABLET | Freq: Every day | ORAL | Status: DC
  Administered 2015-08-24 – 2015-08-28 (×5): 10 mg via ORAL
  Filled 2015-08-24 (×5): qty 1

## 2015-08-24 MED ORDER — SODIUM CHLORIDE 0.9 % IV SOLN
Freq: Once | INTRAVENOUS | Status: AC
  Administered 2015-08-24: 23:00:00 via INTRAVENOUS

## 2015-08-24 MED ORDER — ACETAMINOPHEN 650 MG RE SUPP: 650.0000 mg | RECTAL | Status: DC | PRN

## 2015-08-24 MED ORDER — PIPERACILLIN-TAZOBACTAM 3.375 G IVPB
3.3750 g | Freq: Three times a day (TID) | INTRAVENOUS | Status: DC
  Administered 2015-08-24 – 2015-08-25 (×3): 3.375 g via INTRAVENOUS
  Filled 2015-08-24 (×3): qty 50

## 2015-08-24 MED ORDER — INSULIN ASPART 100 UNIT/ML ~~LOC~~ SOLN
0.0000 [IU] | Freq: Three times a day (TID) | SUBCUTANEOUS | Status: DC
  Administered 2015-08-24 (×2): 5 [IU] via SUBCUTANEOUS
  Administered 2015-08-24 – 2015-08-25 (×4): 2 [IU] via SUBCUTANEOUS
  Administered 2015-08-26: 11 [IU] via SUBCUTANEOUS
  Administered 2015-08-26 – 2015-08-27 (×2): 3 [IU] via SUBCUTANEOUS
  Administered 2015-08-27: 2 [IU] via SUBCUTANEOUS
  Administered 2015-08-27: 3 [IU] via SUBCUTANEOUS
  Administered 2015-08-28: 11 [IU] via SUBCUTANEOUS
  Administered 2015-08-28: 3 [IU] via SUBCUTANEOUS

## 2015-08-24 MED ORDER — LISINOPRIL 20 MG PO TABS
40.0000 mg | ORAL_TABLET | Freq: Every day | ORAL | Status: DC
  Administered 2015-08-24 – 2015-08-28 (×5): 40 mg via ORAL
  Filled 2015-08-24 (×5): qty 2

## 2015-08-24 MED ORDER — ATORVASTATIN CALCIUM 10 MG PO TABS
20.0000 mg | ORAL_TABLET | Freq: Every day | ORAL | Status: DC
  Administered 2015-08-24 – 2015-08-28 (×5): 20 mg via ORAL
  Filled 2015-08-24 (×5): qty 2

## 2015-08-24 MED ORDER — ENOXAPARIN SODIUM 40 MG/0.4ML ~~LOC~~ SOLN
40.0000 mg | Freq: Every day | SUBCUTANEOUS | Status: DC
  Administered 2015-08-24 – 2015-08-27 (×5): 40 mg via SUBCUTANEOUS
  Filled 2015-08-24 (×5): qty 0.4

## 2015-08-24 MED ORDER — SODIUM CHLORIDE 0.9 % IV SOLN
INTRAVENOUS | Status: AC
  Administered 2015-08-24: via INTRAVENOUS

## 2015-08-24 MED ORDER — ONDANSETRON HCL 4 MG PO TABS: 4.0000 mg | ORAL_TABLET | Freq: Four times a day (QID) | ORAL | Status: DC | PRN

## 2015-08-24 MED ORDER — VANCOMYCIN HCL 500 MG IV SOLR
500.0000 mg | Freq: Two times a day (BID) | INTRAVENOUS | Status: DC
  Administered 2015-08-24 – 2015-08-25 (×2): 500 mg via INTRAVENOUS
  Filled 2015-08-24 (×2): qty 500

## 2015-08-24 MED ORDER — ACETAMINOPHEN 325 MG PO TABS
650.0000 mg | ORAL_TABLET | Freq: Four times a day (QID) | ORAL | Status: DC | PRN
  Administered 2015-08-24: 650 mg via ORAL
  Filled 2015-08-24: qty 2

## 2015-08-24 MED ORDER — DONEPEZIL HCL 23 MG PO TABS
23.0000 mg | ORAL_TABLET | Freq: Every day | ORAL | Status: DC
  Administered 2015-08-24 – 2015-08-27 (×5): 23 mg via ORAL
  Filled 2015-08-24 (×5): qty 1

## 2015-08-24 NOTE — Progress Notes (Signed)
Pharmacy Antibiotic Note  Frances [name] is a 70 y.o. female admitted on 08/23/2015 with sepsis and UTI.  Pharmacy has been consulted for Ceftriaxone dosing.  Plan: Ceftriaxone based on manufacturer dosing  Ceftriaxone 2gm iv q24hr   Height: 5\' 7"  (170.2 cm) Weight: 146 lb (66.2 kg) IBW/kg (Calculated) : 61.6  Temp (24hrs), Avg:100.6 F (38.1 C), Min:99.1 F (37.3 C), Max:102.4 F (39.1 C)   Recent Labs Lab 08/23/15 1956 08/23/15 2011 08/23/15 2243 08/24/15 0048 08/24/15 0334  WBC 11.7*  --   --   --  9.1  CREATININE 0.98  --   --   --  0.77  LATICACIDVEN  --  2.00* 1.45 1.9 1.0    Estimated Creatinine Clearance: 63.6 mL/min (by C-G formula based on SCr of 0.8 mg/dL).    Allergies  Allergen Reactions  . Shellfish Allergy Anaphylaxis  . Demerol [Meperidine] Nausea And Vomiting  . Flagyl [Metronidazole] Other (See Comments)    unknown    Antimicrobials this admission: 8/17 Zosyn >> 8/17 8/17 Vancomycin >> 8/17 8/18 Ceftriaxone >>  Dose adjustments this admission: -  Microbiology results: pending  Thank you for allowing pharmacy to be a part of this patient's care.  Nani Skillern Crowford 08/24/2015 4:35 AM

## 2015-08-24 NOTE — Progress Notes (Signed)
CRITICAL VALUE ALERT  Critical value received:  Lactic acid Date of notification: 08/24/15  Time of notification:  [id number]  Critical value read back:Yes.    Nurse who received alert:  GSison RN  MD notified (1st page):  Dr. Doyle Askew  Time of first page:  1840  MD notified (2nd page):  Time of second page:  Responding MD:  Dr. Doyle Askew  Time MD responded:  [phone]

## 2015-08-24 NOTE — Progress Notes (Signed)
Pharmacy Antibiotic Note  Frances [name] is a 70 y.o. female admitted from home on 08/23/2015 with suspected sepsis.  She was seen at her PCP on 8/17 for suspected UTI, but has progressive weakness, decreased appetite.  Vancomycin and zosyn were started on admission and then de-escalated to ceftriaxone on 8/18.  Patient's now febrile-- to resume zosyn and vancomycin back for suspected sepsis.  Plan: - Zosyn 3.375g IV Q8H infused over 4hrs. - Vancomycin 500 mg IV q12h.  _________________________  Height: 5\' 7"  (170.2 cm) Weight: 146 lb (66.2 kg) IBW/kg (Calculated) : 61.6  Temp (24hrs), Avg:101.2 F (38.4 C), Min:99.1 F (37.3 C), Max:103.5 F (39.7 C)   Recent Labs Lab 08/23/15 1956 08/23/15 2011 08/23/15 2243 08/24/15 0048 08/24/15 0334  WBC 11.7*  --   --   --  9.1  CREATININE 0.98  --   --   --  0.77  LATICACIDVEN  --  2.00* 1.45 1.9 1.0    Estimated Creatinine Clearance: 63.6 mL/min (by C-G formula based on SCr of 0.8 mg/dL).    Allergies  Allergen Reactions  . Shellfish Allergy Anaphylaxis  . Demerol [Meperidine] Nausea And Vomiting  . Flagyl [Metronidazole] Other (See Comments)    unknown    Antimicrobials this admission: 8/17 Zosyn >> 8/17>> resume 8/18>> 8/17 Vancomycin >> 8/17>> resume 8/18>> 8/18 Ceftriaxone >> 8/18  Dose adjustments this admission: N/a  Microbiology results: 8/17 BCx x2: sent 8/17 UCx: sent  Thank you for allowing pharmacy to be a part of this patient's care.  Lynelle Doctor 08/24/2015 3:05 PM

## 2015-08-24 NOTE — Progress Notes (Signed)
Pt seen and examined at bedside, admitted after midnight so please see earlier admission note by Dr. Eulas Post. Pt admission for management of sepsis that was thought to be secondary to sepsis. Pt started on Rocephin. On exam pt is hemodynamically stable and cooperative with examination. Family is at bedside and also confirms pt looks much better. Our concern for the day is her persistently high temp up to 103 F. I have asked for repeat lactic acid and continuation of NS boluses with very low threshold to transfer to SDU if temp does not improve. I will also change and broaden ABX coverage to Vanc and Zosyn until we have more results available. Follow up on blood and urine cultures. K is 3.2, will supplement and repeat BMP and CBC in AM. Family updated at bedside. They asked to TB check due to placement issues. Will place order for Quantiferon gold test with AM labs.   Faye Ramsay, MD  [facility] Pager [phone]  If 7PM-7AM, please contact night-coverage [email or web address] Password [email or web address]

## 2015-08-24 NOTE — Progress Notes (Signed)
CRITICAL VALUE ALERT  Critical value received: lactic acid  Date of notification:  08/24/15  Time of notification:  1602  Critical value read back:yes  Nurse who received alert:  Ihor Dow  MD notified (1st page):  Dr. Garwin Brothers  Time of first page:  1605  MD notified (2nd page):  Time of second page:  Responding MD:    Time MD responded:

## 2015-08-24 NOTE — H&P (Signed)
History and Physical    Frances [name] DOB: 19-Nov-1945 DOA: 08/23/2015  PCP: Mathews Argyle, MD   Patient coming from: Home  Chief Complaint: Altered mental status and weakness  HPI: Frances [name] is a 70 y.o. woman with a history of HTN, DM, and dementia who presents to the ED for evaluation of 3 days of progressive generalized weakness and mental status changes.  Her POA/primary caregiver [name] and her sister [name] are present at bedside.  Most of the clinical history is provided by [name].  The patient normally engages in simple conversation at baseline though she is not oriented to time or date.  Family has noted that she less conversant with decreased PO intake for the past few days.  She has had subjective fevers with chills and sweats.  She had a single episode of nause and vomiting yesterday; it was nonbloody.  No diarrhea.  No chest, back, or abdominal pain.  No dysuria.  She has urinary incontinence at baseline and wears Depends.  However, family also reports that she is not to hold her urine for multiple hours at a time.  She can ambulate independently at baseline.  No history of falls, trauma, or LOC.  ED Course: Code Sepsis called for fever to 102, lactic acid level of 2, and WBC count of 11.7 with U/A suggestive of infection (+ nitrite, 6-30 WBC, many bacteria).  She received aggressive IV fluid resuscitation per protocol (30cc/kg) and broad spectrum IV antibiotics (vanc and zosyn) in the ED.  Blood and urine cultures are pending.  Hospitalist asked to admit.  Review of Systems: As per HPI otherwise 10 point review of systems negative.    Past Medical History:  Diagnosis Date  . Cystocele   . Dementia   . Depressed   . Diabetes mellitus   . Endometriosis   . Hypertension   . Rectocele   . Thyroid disease   . Urinary incontinence    USI    Past Surgical History:  Procedure Laterality Date  . basal cell excised    . BLADDER SUSPENSION     A/P Repair-Sling  . HEMORRHOID SURGERY    . KNEE SURGERY    . OOPHORECTOMY  1992   BSO  . TONSILLECTOMY    . VAGINAL HYSTERECTOMY  1992   Vag Hyst BSO     reports that she has never smoked. She has never used smokeless tobacco. She reports that she does not drink alcohol. Her drug history is not on file.  She is not married.  She does not have adult children.  She has a designated POA as noted above.  Allergies  Allergen Reactions  . Shellfish Allergy Anaphylaxis  . Demerol [Meperidine] Nausea And Vomiting  . Flagyl [Metronidazole] Other (See Comments)    unknown    Family History  Problem Relation Age of Onset  . Hypertension Mother   . Stroke Mother   . Heart failure Mother   . Hypertension Father   . Diabetes Sister   . Hypertension Sister   . Breast cancer Sister   . Heart disease Sister      Prior to Admission medications   Medication Sig Start Date End Date Taking? Authorizing Provider  amLODipine (NORVASC) 10 MG tablet Take 10 mg by mouth daily.  01/18/14  Yes Historical Provider, MD  aspirin EC 81 MG tablet Take 81 mg by mouth at bedtime.    Yes Historical Provider, MD  atorvastatin (LIPITOR) 20 MG tablet Take  20 mg by mouth daily.   Yes Historical Provider, MD  cholecalciferol (VITAMIN D) 400 UNITS TABS tablet Take 400 Units by mouth daily.    Yes Historical Provider, MD  co-enzyme Q-10 30 MG capsule Take 30 mg by mouth daily.   Yes Historical Provider, MD  donepezil (ARICEPT) 23 MG TABS tablet Take 23 mg by mouth at bedtime.  07/31/15  Yes Historical Provider, MD  escitalopram (LEXAPRO) 10 MG tablet TAKE 1 TABLET DAILY Patient taking differently: TAKE 1 BY MOUTH TABLET DAILY 07/18/15  Yes Star Age, MD  fosinopril (MONOPRIL) 40 MG tablet Take 40 mg by mouth daily.   Yes Historical Provider, MD  levothyroxine (SYNTHROID, LEVOTHROID) 75 MCG tablet Take 75 mcg by mouth daily.   Yes Historical Provider, MD  Memantine HCl ER (NAMENDA XR) 28 MG CP24 Take 1 capsule by  mouth daily.   Yes Historical Provider, MD  metFORMIN (GLUCOPHAGE) 1000 MG tablet Take 1,000 mg by mouth daily with breakfast.  07/07/14  Yes Historical Provider, MD  vitamin E (VITAMIN E) 1000 UNIT capsule Take 2,000 Units by mouth daily.   Yes Historical Provider, MD    Physical Exam: Vitals:   08/23/15 2117 08/23/15 2214 08/23/15 2322 08/24/15 0022  BP: 130/70 131/60 134/68 (!) 160/81  Pulse: 88 90 87 95  Resp: 20 26 24  (!) 22  Temp:   100.4 F (38 C) 99.1 F (37.3 C)  TempSrc:   Rectal Oral  SpO2: 98% 100% 100% 99%  Weight:    66.2 kg (146 lb)  Height:    5\' 7"  (1.702 m)      Constitutional: NAD, calm, comfortable, opens her eyes to voice, follows simple commands though she has slow responses, unable to follow complex commands Vitals:   08/23/15 2117 08/23/15 2214 08/23/15 2322 08/24/15 0022  BP: 130/70 131/60 134/68 (!) 160/81  Pulse: 88 90 87 95  Resp: 20 26 24  (!) 22  Temp:   100.4 F (38 C) 99.1 F (37.3 C)  TempSrc:   Rectal Oral  SpO2: 98% 100% 100% 99%  Weight:    66.2 kg (146 lb)  Height:    5\' 7"  (1.702 m)   Eyes: PERRL, lids and conjunctivae normal, no apparent nystagmus or gaze preference, she is tracking around the room ENMT: Mucous membranes are dry. Posterior pharynx clear of any exudate or lesions. Normal dentition.  Neck: normal appearance, supple Respiratory: clear to auscultation bilaterally, no wheezing, no crackles. Normal respiratory effort. No accessory muscle use.  Cardiovascular: Normal rate, regular rhythm, no murmurs / rubs / gallops. No extremity edema. 2+ pedal pulses. No carotid bruits.  GI: abdomen is soft and compressible though she has pelvic fullness with distention and mild tenderness to palpation.  Bowel sounds are present. Musculoskeletal:  No joint deformity in upper and lower extremities. Good ROM, no contractures. Normal muscle tone.  Skin: no rashes, warm and dry Neurologic: CN 2-12 grossly intact. Strength symmetric bilaterally,  5/5.  No apparent focal deficits for me.  Aphasia is not new. Psychiatric: Judgment and insight impaired by history of dementia.  Awake and alert but disoriented.  Normal mood.     Labs on Admission: I have personally reviewed following labs and imaging studies  CBC:  Recent Labs Lab 08/23/15 1956  WBC 11.7*  NEUTROABS 9.9*  HGB 14.1  HCT 42.0  MCV 90.7  PLT 99991111   Basic Metabolic Panel:  Recent Labs Lab 08/23/15 1956  NA 132*  K 3.6  CL  96*  CO2 26  GLUCOSE 154*  BUN 13  CREATININE 0.98  CALCIUM 9.1   GFR: Estimated Creatinine Clearance: 51.9 mL/min (by C-G formula based on SCr of 0.98 mg/dL). Liver Function Tests:  Recent Labs Lab 08/23/15 1956  AST 14*  ALT 11*  ALKPHOS 65  BILITOT 1.7*  PROT 8.2*  ALBUMIN 4.2   Coagulation Profile:  Recent Labs Lab 08/24/15 0048  INR 1.17   Urine analysis:    Component Value Date/Time   COLORURINE YELLOW 08/23/2015 1920   APPEARANCEUR CLOUDY (A) 08/23/2015 1920   LABSPEC 1.017 08/23/2015 1920   PHURINE 5.0 08/23/2015 1920   GLUCOSEU NEGATIVE 08/23/2015 1920   HGBUR SMALL (A) 08/23/2015 1920   BILIRUBINUR NEGATIVE 08/23/2015 1920   KETONESUR NEGATIVE 08/23/2015 1920   PROTEINUR NEGATIVE 08/23/2015 1920   NITRITE POSITIVE (A) 08/23/2015 1920   LEUKOCYTESUR SMALL (A) 08/23/2015 1920   Sepsis Labs:  Lactic acid level 2, repeat 1.45 Procalcitonin level 0.23  Radiological Exams on Admission: Dg Chest 2 View  Result Date: 08/23/2015 CLINICAL DATA:  Sepsis. EXAM: CHEST  2 VIEW COMPARISON:  04/18/2009 FINDINGS: Two views of the chest demonstrate slightly low lung volumes. No focal airspace disease or pulmonary edema. Heart and mediastinum are within normal limits. Mild degenerative changes in the thoracic spine. No large pleural effusions. IMPRESSION: Low lung volumes without focal disease. Electronically Signed   By: Markus Daft M.D.   On: 08/23/2015 20:05   Ct Head Wo Contrast  Result Date:  08/24/2015 CLINICAL DATA:  70 y/o F; decreased appetite and 2 days of increasingly less activity with history of dementia. Oral EXAM: CT HEAD WITHOUT CONTRAST TECHNIQUE: Contiguous axial images were obtained from the base of the skull through the vertex without intravenous contrast. COMPARISON:  CT of head dated 01/25/2015. FINDINGS: Brain: No evidence of acute infarction, hemorrhage, hydrocephalus, extra-axial collection or mass lesion/mass effect. Stable mild parenchymal volume loss and chronic microvascular ischemic changes. Vascular: No hyperdense vessel or unexpected calcification. Skull: No displaced skull fracture. Sinuses/Orbits: No acute finding. Other: No unexpected finding. IMPRESSION: No acute intracranial abnormality is identified. Stable mild parenchymal volume loss and chronic microvascular ischemic changes. Electronically Signed   By: Kristine Garbe M.D.   On: 08/24/2015 00:00    EKG: Independently reviewed. NSR, no ST depressions greater than 1 mm per my review  Assessment/Plan Active Problems:   Urinary incontinence   Hypothyroid   Dementia   Diabetes (HCC)   Sepsis (HCC)   UTI (lower urinary tract infection)   Leukocytosis   Fever      Sepsis secondary to UTI --S/P weight based volume resuscitation in the ED, continue NS at maintenance rate for now --IV Rocephin since urine is now suspected source, D/C vanc and zosyn --Follow up blood and urine cultures  AMS --Likely secondary to Sepsis and UTI --Head CT ordered per family request; no acute findings --Expect mental status to improve as sepsis resolves  HTN --Continue amlodipine and fosinopril  DM --Hold metformin --SSI coverage AC/HS  History of dementia --Continue home doses of aricept and namenda  Depression --Continue home dose of lexapro  Hypothyroidism --Continue home dose of levothyroxine   DVT prophylaxis: Lovenox Code Status: FULL Family Communication: Sister and POA/primary  caregiver at bedside in the ED at time of admission Disposition Plan: To be determined Consults called: NONE Admission status: Inpatient telemetry   TIME SPENT: 70 minutes   Eber Jones MD [facility] Pager [phone]  If 7PM-7AM, please contact night-coverage  [email or web address] Password [email or web address]  08/24/2015, 1:45 AM

## 2015-08-25 DIAGNOSIS — R509 Fever, unspecified: Secondary | ICD-10-CM

## 2015-08-25 DIAGNOSIS — A419 Sepsis, unspecified organism: Secondary | ICD-10-CM

## 2015-08-25 DIAGNOSIS — F0390 Unspecified dementia without behavioral disturbance: Secondary | ICD-10-CM

## 2015-08-25 DIAGNOSIS — R7881 Bacteremia: Secondary | ICD-10-CM

## 2015-08-25 LAB — BLOOD CULTURE ID PANEL (REFLEXED)
Acinetobacter baumannii: NOT DETECTED
CANDIDA ALBICANS: NOT DETECTED
CANDIDA TROPICALIS: NOT DETECTED
CARBAPENEM RESISTANCE: NOT DETECTED
Candida glabrata: NOT DETECTED
Candida krusei: NOT DETECTED
Candida parapsilosis: NOT DETECTED
ENTEROBACTER CLOACAE COMPLEX: NOT DETECTED
ENTEROBACTERIACEAE SPECIES: DETECTED — AB
ENTEROCOCCUS SPECIES: NOT DETECTED
Escherichia coli: DETECTED — AB
HAEMOPHILUS INFLUENZAE: NOT DETECTED
Klebsiella oxytoca: NOT DETECTED
Klebsiella pneumoniae: NOT DETECTED
Listeria monocytogenes: NOT DETECTED
METHICILLIN RESISTANCE: NOT DETECTED
Neisseria meningitidis: NOT DETECTED
PSEUDOMONAS AERUGINOSA: NOT DETECTED
Proteus species: NOT DETECTED
STAPHYLOCOCCUS AUREUS BCID: NOT DETECTED
STAPHYLOCOCCUS SPECIES: NOT DETECTED
STREPTOCOCCUS AGALACTIAE: NOT DETECTED
STREPTOCOCCUS PNEUMONIAE: NOT DETECTED
STREPTOCOCCUS PYOGENES: NOT DETECTED
Serratia marcescens: NOT DETECTED
Streptococcus species: NOT DETECTED
VANCOMYCIN RESISTANCE: NOT DETECTED

## 2015-08-25 LAB — CBC
HCT: 36 % (ref 36.0–46.0)
HEMOGLOBIN: 12.3 g/dL (ref 12.0–15.0)
MCH: 30.1 pg (ref 26.0–34.0)
MCHC: 34.2 g/dL (ref 30.0–36.0)
MCV: 88.2 fL (ref 78.0–100.0)
PLATELETS: 141 10*3/uL — AB (ref 150–400)
RBC: 4.08 MIL/uL (ref 3.87–5.11)
RDW: 12.6 % (ref 11.5–15.5)
WBC: 4.3 10*3/uL (ref 4.0–10.5)

## 2015-08-25 LAB — BASIC METABOLIC PANEL
ANION GAP: 5 (ref 5–15)
BUN: 7 mg/dL (ref 6–20)
CHLORIDE: 109 mmol/L (ref 101–111)
CO2: 26 mmol/L (ref 22–32)
Calcium: 8 mg/dL — ABNORMAL LOW (ref 8.9–10.3)
Creatinine, Ser: 0.72 mg/dL (ref 0.44–1.00)
GFR calc Af Amer: >60 mL/min (ref >60)
GLUCOSE: 129 mg/dL — AB (ref 65–99)
POTASSIUM: 3 mmol/L — AB (ref 3.5–5.1)
Sodium: 140 mmol/L (ref 135–145)

## 2015-08-25 LAB — GLUCOSE, CAPILLARY
GLUCOSE-CAPILLARY: 124 mg/dL — AB (ref 65–99)
GLUCOSE-CAPILLARY: 178 mg/dL — AB (ref 65–99)
Glucose-Capillary: 127 mg/dL — ABNORMAL HIGH (ref 65–99)
Glucose-Capillary: 132 mg/dL — ABNORMAL HIGH (ref 65–99)

## 2015-08-25 LAB — HEMOGLOBIN A1C
Hgb A1c MFr Bld: 6.1 % — ABNORMAL HIGH (ref 4.8–5.6)
MEAN PLASMA GLUCOSE: 128 mg/dL

## 2015-08-25 LAB — LACTIC ACID, PLASMA: Lactic Acid, Venous: 1.3 mmol/L (ref 0.5–1.9)

## 2015-08-25 MED ORDER — DEXTROSE 5 % IV SOLN
2.0000 g | INTRAVENOUS | Status: DC
  Administered 2015-08-25 – 2015-08-26 (×2): 2 g via INTRAVENOUS
  Filled 2015-08-25 (×3): qty 2

## 2015-08-25 MED ORDER — POTASSIUM CHLORIDE CRYS ER 20 MEQ PO TBCR
40.0000 meq | EXTENDED_RELEASE_TABLET | Freq: Once | ORAL | Status: AC
  Administered 2015-08-25: 40 meq via ORAL
  Filled 2015-08-25: qty 2

## 2015-08-25 MED ORDER — SODIUM CHLORIDE 0.9 % IV SOLN
1000.0000 mL | INTRAVENOUS | Status: DC
  Administered 2015-08-25 – 2015-08-26 (×3): 1000 mL via INTRAVENOUS

## 2015-08-25 NOTE — Evaluation (Signed)
Physical Therapy Evaluation Patient Details Name: Frances [name] MRN: [id number] DOB: Jun 14, 1945 Today's Date: 08/25/2015   History of Present Illness  Patient is a 70 yo female admitted 08/23/15 with weakness, AMS, sepsis, UTI.     PMH:  HTN, DM, dementia  Clinical Impression  Patient presents with problems listed below.  Will benefit from acute PT to maximize functional mobility prior to d/c home with friend and caregiver.  Patient with general weakness impacting mobility.  Should return to baseline prior to d/c.  Will need to continue 24/7 assist for safety due to cognition.  Do not anticipate any f/u PT needs at d/c.    Follow Up Recommendations Supervision/Assistance - 24 hour;No PT follow up    Equipment Recommendations  3in1 (PT)    Recommendations for Other Services       Precautions / Restrictions Precautions Precautions: Fall Precaution Comments: Has not had any falls per family/caregiver Restrictions Weight Bearing Restrictions: No      Mobility  Bed Mobility Overal bed mobility: Needs Assistance Bed Mobility: Supine to Sit;Sit to Supine     Supine to sit: Min guard;HOB elevated Sit to supine: Min guard   General bed mobility comments: Verbal and tactile cues to move to EOB.  Patient with good sitting balance.  Removed mittens during session.  Patient very distracted by telemetry monitor/leads.  Patient able to return to supine with min guard assist.  Transfers Overall transfer level: Needs assistance Equipment used: None Transfers: Sit to/from Stand Sit to Stand: Min guard         General transfer comment: No physical assist required.  Assist for balance/safety.  Ambulation/Gait Ambulation/Gait assistance: Min assist Ambulation Distance (Feet): 16 Feet Assistive device: None Gait Pattern/deviations: Step-through pattern;Decreased step length - right;Decreased step length - left;Decreased stride length;Drifts right/left     General Gait Details:  Patient with slightly unsteady gait.  Required repeated cues to keep patient on task.  Stairs            Wheelchair Mobility    Modified Rankin (Stroke Patients Only)       Balance Overall balance assessment: Needs assistance         Standing balance support: No upper extremity supported Standing balance-Leahy Scale: Good                               Pertinent Vitals/Pain Pain Assessment: No/denies pain    Home Living Family/patient expects to be discharged to:: Private residence Living Arrangements: Non-relatives/Friends Available Help at Discharge: Friend(s);Family;Available 24 hours/day (Lives with friend.  Friend's daughter is caregiver Frances [name])) Type of Home: House Home Access: Stairs to enter Entrance Stairs-Rails: None Entrance Stairs-Number of Steps: 2 Home Layout: One level Home Equipment: None      Prior Function Level of Independence: Independent;Needs assistance   Gait / Transfers Assistance Needed: Patient is independent with ambulation.  ADL's / Homemaking Assistance Needed: Assist for all ADL's, meal prep, housekeeping.        Hand Dominance        Extremity/Trunk Assessment   Upper Extremity Assessment: Overall WFL for tasks assessed (Has mittens in place - patient pulling IV's out.)           Lower Extremity Assessment: Generalized weakness         Communication   Communication: Expressive difficulties (Minimal speech.  Good receptive communication.)  Cognition Arousal/Alertness: Awake/alert Behavior During Therapy: Restless;Impulsive Overall Cognitive Status: History of  cognitive impairments - at baseline       Memory: Decreased short-term memory              General Comments      Exercises        Assessment/Plan    PT Assessment Patient needs continued PT services  PT Diagnosis Abnormality of gait;Generalized weakness;Altered mental status   PT Problem List Decreased strength;Decreased  activity tolerance;Decreased balance;Decreased mobility;Decreased cognition;Decreased safety awareness  PT Treatment Interventions Gait training;Functional mobility training;Therapeutic activities;Patient/family education   PT Goals (Current goals can be found in the Care Plan section) Acute Rehab PT Goals Patient Stated Goal: Unable to state PT Goal Formulation: With family Time For Goal Achievement: 09/01/15 Potential to Achieve Goals: Fair    Frequency Min 3X/week   Barriers to discharge        Co-evaluation               End of Session Equipment Utilized During Treatment: Gait belt Activity Tolerance: Patient tolerated treatment well (Limited by external distractions) Patient left: in bed;with call bell/phone within reach;with bed alarm set;with family/visitor present;with restraints reapplied Nurse Communication: Mobility status         Time: [id number] PT Time Calculation (min) (ACUTE ONLY): 22 min   Charges:   PT Evaluation $PT Eval Moderate Complexity: 1 Procedure     PT G CodesDespina Pole 2015/08/31, 7:28 PM Carita Pian. Sanjuana Kava, [place] Pager [phone]

## 2015-08-25 NOTE — Progress Notes (Signed)
Last night at 2254, patient's rectal temp was 101.7. Tylenol had already been given at 2129. On call was notified and new orders were given for a one time dose of Ibuprofen and a 1L bolus NS. Temperature was rechecked at 0034 after meds were given and it had went down to 101.1. By this morning, fever had broke and temperature was 97.8.

## 2015-08-25 NOTE — Progress Notes (Signed)
PROGRESS NOTE    KASYN APFELBAUM  [id number] DOB: [birth year] DOA: 08/23/2015 PCP: Mathews Argyle, MD   Outpatient Specialists:    Brief Narrative:  Frances [name] is a 70 y.o. woman with a history of HTN, DM, and dementia who presents to the ED for evaluation of 3 days of progressive generalized weakness and mental status changes.  Her POA/primary caregiver [name] and her sister [name] are present at bedside.  Most of the clinical history is provided by [facility].  The patient normally engages in simple conversation at baseline though she is not oriented to time or date.  Family has noted that she less conversant with decreased PO intake for the past few days.  She has had subjective fevers with chills and sweats.  She had a single episode of nause and vomiting yesterday; it was nonbloody.  No diarrhea.  No chest, back, or abdominal pain.  No dysuria.  She has urinary incontinence at baseline and wears Depends.  However, family also reports that she is not to hold her urine for multiple hours at a time.  She can ambulate independently at baseline.  No history of falls, trauma, or LOC.   Assessment & Plan:   Active Problems:   Urinary incontinence   Hypothyroid   Dementia   Diabetes (HCC)   Sepsis (HCC)   UTI (lower urinary tract infection)   Leukocytosis   Fever   Altered mental status   Sepsis secondary to UTI and bacteremia --IV Rocephin --Follow up blood and urine cultures- e coli If continues to spike temperatures, will get U/S or CT scan to look for abscess  Lactic acidosis -recheck  Hypokalemia -replete Recheck in AM  AMS --Likely secondary to Sepsis and UTI --does not speak at baseline  HTN --Continue amlodipine and fosinopril  DM --Hold metformin --SSI coverage AC/HS  History of dementia --Continue home doses of aricept and namenda - ambulates per caregiver but only speaks a few words  Depression --Continue home dose of  lexapro  Hypothyroidism --Continue home dose of levothyroxine   DVT prophylaxis:  Lovenox   Code Status: Full Code   Family Communication: Caregiver on phone  Disposition Plan:  Back home once bacteria cleared   Consultants:     Procedures:        Subjective: Awake, smiling, no words  Objective: Vitals:   08/25/15 0217 08/25/15 0427 08/25/15 0820 08/25/15 1005  BP:  103/69    Pulse:  82    Resp:  18    Temp: 99.3 F (37.4 C) 97.8 F (36.6 C) 100.1 F (37.8 C) 99.4 F (37.4 C)  TempSrc: Rectal Rectal Rectal Rectal  SpO2:  98%    Weight:      Height:        Intake/Output Summary (Last 24 hours) at 08/25/15 1244 Last data filed at 08/25/15 0500  Gross per 24 hour  Intake          4267.92 ml  Output             5425 ml  Net         -1157.08 ml   Filed Weights   08/23/15 1947 08/24/15 0022  Weight: 66.2 kg (146 lb) 66.2 kg (146 lb)    Examination:  General exam: Appears calm and comfortable  Respiratory system: Clear to auscultation. Respiratory effort normal. Cardiovascular system: S1 & S2 heard, RRR. No JVD, murmurs, rubs, gallops or clicks. No pedal edema. Gastrointestinal system: Abdomen is nondistended, soft and  nontender. No organomegaly or masses felt. Normal bowel sounds heard. Skin: no breakdown    Data Reviewed: I have personally reviewed following labs and imaging studies  CBC:  Recent Labs Lab 08/23/15 1956 08/24/15 0334 08/25/15 0807  WBC 11.7* 9.1 4.3  NEUTROABS 9.9*  --   --   HGB 14.1 14.1 12.3  HCT 42.0 42.0 36.0  MCV 90.7 89.9 88.2  PLT 198 148* [id number]*   Basic Metabolic Panel:  Recent Labs Lab 08/23/15 1956 08/24/15 0334 08/25/15 0807  NA 132* 137 140  K 3.6 3.2* 3.0*  CL 96* 104 109  CO2 26 23 26   GLUCOSE 154* 166* 129*  BUN 13 9 7   CREATININE 0.98 0.77 0.72  CALCIUM 9.1 8.2* 8.0*   GFR: Estimated Creatinine Clearance: 63.6 mL/min (by C-G formula based on SCr of 0.8 mg/dL). Liver Function  Tests:  Recent Labs Lab 08/23/15 1956  AST 14*  ALT 11*  ALKPHOS 65  BILITOT 1.7*  PROT 8.2*  ALBUMIN 4.2   No results for input(s): LIPASE, AMYLASE in the last 168 hours. No results for input(s): AMMONIA in the last 168 hours. Coagulation Profile:  Recent Labs Lab 08/24/15 0048  INR 1.17   Cardiac Enzymes: No results for input(s): CKTOTAL, CKMB, CKMBINDEX, TROPONINI in the last 168 hours. BNP (last 3 results) No results for input(s): PROBNP in the last 8760 hours. HbA1C:  Recent Labs  08/24/15 0048  HGBA1C 6.1*   CBG:  Recent Labs Lab 08/24/15 1219 08/24/15 1606 08/24/15 2256 08/25/15 0805 08/25/15 1236  GLUCAP 221* 235* 201* 124* 127*   Lipid Profile: No results for input(s): CHOL, HDL, LDLCALC, TRIG, CHOLHDL, LDLDIRECT in the last 72 hours. Thyroid Function Tests: No results for input(s): TSH, T4TOTAL, FREET4, T3FREE, THYROIDAB in the last 72 hours. Anemia Panel: No results for input(s): VITAMINB12, FOLATE, FERRITIN, TIBC, IRON, RETICCTPCT in the last 72 hours. Urine analysis:    Component Value Date/Time   COLORURINE YELLOW 08/23/2015 1920   APPEARANCEUR CLOUDY (A) 08/23/2015 1920   LABSPEC 1.017 08/23/2015 1920   PHURINE 5.0 08/23/2015 1920   GLUCOSEU NEGATIVE 08/23/2015 1920   HGBUR SMALL (A) 08/23/2015 1920   BILIRUBINUR NEGATIVE 08/23/2015 1920   KETONESUR NEGATIVE 08/23/2015 1920   PROTEINUR NEGATIVE 08/23/2015 1920   NITRITE POSITIVE (A) 08/23/2015 1920   LEUKOCYTESUR SMALL (A) 08/23/2015 1920     ) Recent Results (from the past 240 hour(s))  Blood Culture (routine x 2)     Status: None (Preliminary result)   Collection Time: 08/23/15  7:56 PM  Result Value Ref Range Status   Specimen Description BLOOD RIGHT ANTECUBITAL  Final   Special Requests BOTTLES DRAWN AEROBIC AND ANAEROBIC 5CC  Final   Culture   Final    NO GROWTH < 24 HOURS Performed at [facility]    Report Status PENDING  Incomplete  Blood Culture (routine x 2)      Status: None (Preliminary result)   Collection Time: 08/23/15  8:10 PM  Result Value Ref Range Status   Specimen Description BLOOD BLOOD LEFT FOREARM  Final   Special Requests BOTTLES DRAWN AEROBIC AND ANAEROBIC 5CC  Final   Culture  Setup Time   Final    GRAM NEGATIVE RODS AEROBIC BOTTLE ONLY CRITICAL RESULT CALLED TO, READ BACK BY AND VERIFIED WITH: B GREEN,PHARMD AT 1100 08/25/15 BY L BENFIELD Performed at [facility]    Culture PENDING  Incomplete   Report Status PENDING  Incomplete  Blood Culture  ID Panel (Reflexed)     Status: Abnormal   Collection Time: 08/23/15  8:10 PM  Result Value Ref Range Status   Enterococcus species NOT DETECTED NOT DETECTED Final   Vancomycin resistance NOT DETECTED NOT DETECTED Final   Listeria monocytogenes NOT DETECTED NOT DETECTED Final   Staphylococcus species NOT DETECTED NOT DETECTED Final   Staphylococcus aureus NOT DETECTED NOT DETECTED Final   Methicillin resistance NOT DETECTED NOT DETECTED Final   Streptococcus species NOT DETECTED NOT DETECTED Final   Streptococcus agalactiae NOT DETECTED NOT DETECTED Final   Streptococcus pneumoniae NOT DETECTED NOT DETECTED Final   Streptococcus pyogenes NOT DETECTED NOT DETECTED Final   Acinetobacter baumannii NOT DETECTED NOT DETECTED Final   Enterobacteriaceae species DETECTED (A) NOT DETECTED Final    Comment: CRITICAL RESULT CALLED TO, READ BACK BY AND VERIFIED WITH: B GREEN,PHARMD AT 1100 08/25/15 BY L BENFIELD    Enterobacter cloacae complex NOT DETECTED NOT DETECTED Final   Escherichia coli DETECTED (A) NOT DETECTED Final    Comment: CRITICAL RESULT CALLED TO, READ BACK BY AND VERIFIED WITH: B GREEN,PHARMD AT 1100 08/25/15 BY L BENFIELD    Klebsiella oxytoca NOT DETECTED NOT DETECTED Final   Klebsiella pneumoniae NOT DETECTED NOT DETECTED Final   Proteus species NOT DETECTED NOT DETECTED Final   Serratia marcescens NOT DETECTED NOT DETECTED Final   Carbapenem resistance NOT  DETECTED NOT DETECTED Final   Haemophilus influenzae NOT DETECTED NOT DETECTED Final   Neisseria meningitidis NOT DETECTED NOT DETECTED Final   Pseudomonas aeruginosa NOT DETECTED NOT DETECTED Final   Candida albicans NOT DETECTED NOT DETECTED Final   Candida glabrata NOT DETECTED NOT DETECTED Final   Candida krusei NOT DETECTED NOT DETECTED Final   Candida parapsilosis NOT DETECTED NOT DETECTED Final   Candida tropicalis NOT DETECTED NOT DETECTED Final    Comment: Performed at [place]    Start     Dose/Rate Route Frequency Ordered Stop   08/25/15 1200  cefTRIAXone (ROCEPHIN) 2 g in dextrose 5 % 50 mL IVPB     2 g 100 mL/hr over 30 Minutes Intravenous Every 24 hours 08/25/15 1113     08/24/15 2200  cefTRIAXone (ROCEPHIN) 2 g in dextrose 5 % 50 mL IVPB  Status:  Discontinued     2 g 100 mL/hr over 30 Minutes Intravenous Every 24 hours 08/24/15 0037 08/24/15 1505   08/24/15 1600  piperacillin-tazobactam (ZOSYN) IVPB 3.375 g  Status:  Discontinued     3.375 g 12.5 mL/hr over 240 Minutes Intravenous Every 8 hours 08/24/15 1515 08/25/15 1113   08/24/15 1600  vancomycin (VANCOCIN) 500 mg in sodium chloride 0.9 % 100 mL IVPB  Status:  Discontinued     500 mg 100 mL/hr over 60 Minutes Intravenous Every 12 hours 08/24/15 1515 08/25/15 1112   08/24/15 1000  vancomycin (VANCOCIN) 500 mg in sodium chloride 0.9 % 100 mL IVPB  Status:  Discontinued     500 mg 100 mL/hr over 60 Minutes Intravenous Every 12 hours 08/23/15 2125 08/24/15 0013   08/24/15 0400  piperacillin-tazobactam (ZOSYN) IVPB 3.375 g  Status:  Discontinued     3.375 g 12.5 mL/hr over 240 Minutes Intravenous Every 8 hours 08/23/15 2125 08/24/15 0013   08/24/15 0015  cefTRIAXone (ROCEPHIN) 2 g in dextrose 5 % 50 mL IVPB     2 g 100 mL/hr over 30 Minutes Intravenous  Once 08/24/15 0013 08/24/15 0127   08/23/15 1930  piperacillin-tazobactam (ZOSYN) IVPB 3.375 g     3.375 g 100 mL/hr over 30 Minutes  Intravenous  Once 08/23/15 1925 08/23/15 2100   08/23/15 1930  vancomycin (VANCOCIN) IVPB 1000 mg/200 mL premix     1,000 mg 200 mL/hr over 60 Minutes Intravenous  Once 08/23/15 1925 08/23/15 2207       Radiology Studies: Dg Chest 2 View  Result Date: 08/23/2015 CLINICAL DATA:  Sepsis. EXAM: CHEST  2 VIEW COMPARISON:  04/18/2009 FINDINGS: Two views of the chest demonstrate slightly low lung volumes. No focal airspace disease or pulmonary edema. Heart and mediastinum are within normal limits. Mild degenerative changes in the thoracic spine. No large pleural effusions. IMPRESSION: Low lung volumes without focal disease. Electronically Signed   By: Markus Daft M.D.   On: 08/23/2015 20:05   Ct Head Wo Contrast  Result Date: 08/24/2015 CLINICAL DATA:  70 y/o F; decreased appetite and 2 days of increasingly less activity with history of dementia. Oral EXAM: CT HEAD WITHOUT CONTRAST TECHNIQUE: Contiguous axial images were obtained from the base of the skull through the vertex without intravenous contrast. COMPARISON:  CT of head dated 01/25/2015. FINDINGS: Brain: No evidence of acute infarction, hemorrhage, hydrocephalus, extra-axial collection or mass lesion/mass effect. Stable mild parenchymal volume loss and chronic microvascular ischemic changes. Vascular: No hyperdense vessel or unexpected calcification. Skull: No displaced skull fracture. Sinuses/Orbits: No acute finding. Other: No unexpected finding. IMPRESSION: No acute intracranial abnormality is identified. Stable mild parenchymal volume loss and chronic microvascular ischemic changes. Electronically Signed   By: Kristine Garbe M.D.   On: 08/24/2015 00:00        Scheduled Meds: . amLODipine  10 mg Oral Daily  . aspirin EC  81 mg Oral QHS  . atorvastatin  20 mg Oral Daily  . cefTRIAXone (ROCEPHIN)  IV  2 g Intravenous Q24H  . donepezil  23 mg Oral QHS  . enoxaparin (LOVENOX) injection  40 mg Subcutaneous QHS  . escitalopram   10 mg Oral Daily  . insulin aspart  0-15 Units Subcutaneous TID WC  . levothyroxine  75 mcg Oral QAC breakfast  . lisinopril  40 mg Oral Daily  . memantine  28 mg Oral Daily  . potassium chloride  40 mEq Oral Once  . sodium chloride flush  3 mL Intravenous Q12H   Continuous Infusions: . sodium chloride 1,000 mL (08/25/15 1045)     LOS: 2 days    Time spent: 35 min    [place], DO [facility] Pager [phone]  If 7PM-7AM, please contact night-coverage [email or web address] Password [facility] 08/25/2015, 12:44 PM

## 2015-08-25 NOTE — Progress Notes (Signed)
  PHARMACY - PHYSICIAN COMMUNICATION CRITICAL VALUE ALERT - BLOOD CULTURE IDENTIFICATION (BCID)  Results for orders placed or performed during the hospital encounter of 08/23/15  Blood Culture ID Panel (Reflexed) (Collected: 08/23/2015  8:10 PM)  Result Value Ref Range   Enterococcus species NOT DETECTED NOT DETECTED   Vancomycin resistance NOT DETECTED NOT DETECTED   Listeria monocytogenes NOT DETECTED NOT DETECTED   Staphylococcus species NOT DETECTED NOT DETECTED   Staphylococcus aureus NOT DETECTED NOT DETECTED   Methicillin resistance NOT DETECTED NOT DETECTED   Streptococcus species NOT DETECTED NOT DETECTED   Streptococcus agalactiae NOT DETECTED NOT DETECTED   Streptococcus pneumoniae NOT DETECTED NOT DETECTED   Streptococcus pyogenes NOT DETECTED NOT DETECTED   Acinetobacter baumannii NOT DETECTED NOT DETECTED   Enterobacteriaceae species DETECTED (A) NOT DETECTED   Enterobacter cloacae complex NOT DETECTED NOT DETECTED   Escherichia coli DETECTED (A) NOT DETECTED   Klebsiella oxytoca NOT DETECTED NOT DETECTED   Klebsiella pneumoniae NOT DETECTED NOT DETECTED   Proteus species NOT DETECTED NOT DETECTED   Serratia marcescens NOT DETECTED NOT DETECTED   Carbapenem resistance NOT DETECTED NOT DETECTED   Haemophilus influenzae NOT DETECTED NOT DETECTED   Neisseria meningitidis NOT DETECTED NOT DETECTED   Pseudomonas aeruginosa NOT DETECTED NOT DETECTED   Candida albicans NOT DETECTED NOT DETECTED   Candida glabrata NOT DETECTED NOT DETECTED   Candida krusei NOT DETECTED NOT DETECTED   Candida parapsilosis NOT DETECTED NOT DETECTED   Candida tropicalis NOT DETECTED NOT DETECTED    Name of physician (or Provider) Contacted: Dr. Eliseo Squires  Changes to prescribed antibiotics required: change vancomycin/zosyn to Rocephin 2g IV q24h,  Peggyann Juba, PharmD, BCPS Pager: [phone] 08/25/2015  11:17 AM

## 2015-08-26 ENCOUNTER — Inpatient Hospital Stay (HOSPITAL_COMMUNITY): Payer: Medicare Other

## 2015-08-26 DIAGNOSIS — R32 Unspecified urinary incontinence: Secondary | ICD-10-CM

## 2015-08-26 DIAGNOSIS — D72829 Elevated white blood cell count, unspecified: Secondary | ICD-10-CM

## 2015-08-26 LAB — CBC
HEMATOCRIT: 36.9 % (ref 36.0–46.0)
Hemoglobin: 12.5 g/dL (ref 12.0–15.0)
MCH: 29.9 pg (ref 26.0–34.0)
MCHC: 33.9 g/dL (ref 30.0–36.0)
MCV: 88.3 fL (ref 78.0–100.0)
PLATELETS: 180 10*3/uL (ref 150–400)
RBC: 4.18 MIL/uL (ref 3.87–5.11)
RDW: 12.9 % (ref 11.5–15.5)
WBC: 3.1 10*3/uL — ABNORMAL LOW (ref 4.0–10.5)

## 2015-08-26 LAB — GLUCOSE, CAPILLARY
GLUCOSE-CAPILLARY: 199 mg/dL — AB (ref 65–99)
GLUCOSE-CAPILLARY: 321 mg/dL — AB (ref 65–99)
GLUCOSE-CAPILLARY: 126 mg/dL — AB (ref 65–99)
Glucose-Capillary: 120 mg/dL — ABNORMAL HIGH (ref 65–99)

## 2015-08-26 LAB — BASIC METABOLIC PANEL
Anion gap: 7 (ref 5–15)
BUN: 8 mg/dL (ref 6–20)
CO2: 24 mmol/L (ref 22–32)
Calcium: 8.4 mg/dL — ABNORMAL LOW (ref 8.9–10.3)
Chloride: 108 mmol/L (ref 101–111)
Creatinine, Ser: 0.66 mg/dL (ref 0.44–1.00)
GFR calc Af Amer: >60 mL/min (ref >60)
GLUCOSE: 125 mg/dL — AB (ref 65–99)
POTASSIUM: 3 mmol/L — AB (ref 3.5–5.1)
Sodium: 139 mmol/L (ref 135–145)

## 2015-08-26 LAB — URINE CULTURE: Culture: 70000 — AB

## 2015-08-26 MED ORDER — POTASSIUM CHLORIDE CRYS ER 20 MEQ PO TBCR
40.0000 meq | EXTENDED_RELEASE_TABLET | Freq: Once | ORAL | Status: AC
  Administered 2015-08-26: 40 meq via ORAL
  Filled 2015-08-26: qty 2

## 2015-08-26 NOTE — Progress Notes (Signed)
PROGRESS NOTE    Frances [name]  [id number] DOB: Jan 25, 1945 DOA: 08/23/2015 PCP: Frances Argyle, MD   Outpatient Specialists:    Brief Narrative:  Frances [name] is a 70 y.o. woman with a history of HTN, DM, and dementia who presents to the ED for evaluation of 3 days of progressive generalized weakness and mental status changes.  Her POA/primary caregiver Frances [name] and her sister Frances [name] are present at bedside.  Most of the clinical history is provided by Frances [name].  The patient normally engages in simple conversation at baseline though she is not oriented to time or date.  Family has noted that she less conversant with decreased PO intake for the past few days.  She has had subjective fevers with chills and sweats.  She had a single episode of nause and vomiting yesterday; it was nonbloody.  No diarrhea.  No chest, back, or abdominal pain.  No dysuria.  She has urinary incontinence at baseline and wears Depends.  However, family also reports that she is not to hold her urine for multiple hours at a time.  She can ambulate independently at baseline.  No history of falls, trauma, or LOC.   Assessment & Plan:   Active Problems:   Urinary incontinence   Hypothyroid   Dementia   Diabetes (HCC)   Sepsis (HCC)   UTI (lower urinary tract infection)   Leukocytosis   Fever   Altered mental status   Sepsis secondary to UTI and bacteremia --IV Rocephin --Follow up blood and urine cultures- e coli continues to spike temperatures, will get U/S  to [name] for abscess  Lactic acidosis -recheck normal  Hypokalemia -replete Recheck in AM  AMS --appears to be at baseline  HTN --Continue amlodipine and fosinopril  DM --Hold metformin --SSI coverage AC/HS  History of dementia --Continue home doses of aricept and namenda - ambulates per caregiver but only speaks a few words  Depression --Continue home dose of lexapro  Hypothyroidism --Continue home dose of  levothyroxine   DVT prophylaxis:  Lovenox   Code Status: Full Code   Family Communication: Caregiver on phone  Disposition Plan:  Back home once fever gone for 24 hours   Consultants:     Procedures:        Subjective: Still with fever  Objective: Vitals:   08/25/15 2014 08/26/15 0047 08/26/15 0458 08/26/15 0928  BP: (!) 151/74 129/90 137/74 (!) 148/64  Pulse: 92 81 81 79  Resp: 18 18 20 18   Temp: (!) 100.9 F (38.3 C) (!) 100.9 F (38.3 C) 100.2 F (37.9 C) (!) 101 F (38.3 C)  TempSrc: Rectal  Rectal Rectal  SpO2: 96% 96% 97% 98%  Weight:      Height:        Intake/Output Summary (Last 24 hours) at 08/26/15 1124 Last data filed at 08/26/15 0600  Gross per 24 hour  Intake          2273.75 ml  Output             4760 ml  Net         -2486.25 ml   Filed Weights   08/23/15 1947 08/24/15 0022  Weight: 66.2 kg (146 lb) 66.2 kg (146 lb)    Examination:  General exam: Appears calm and comfortable  Respiratory system: Clear to auscultation. Respiratory effort normal. Cardiovascular system: S1 & S2 heard, RRR. No JVD, murmurs, rubs, gallops or clicks. No pedal edema. Gastrointestinal system: Abdomen is nondistended, soft and nontender.  No organomegaly or masses felt. Normal bowel sounds heard. Skin: no breakdown    Data Reviewed: I have personally reviewed following labs and imaging studies  CBC:  Recent Labs Lab 08/23/15 1956 08/24/15 0334 08/25/15 0807 08/26/15 0607  WBC 11.7* 9.1 4.3 3.1*  NEUTROABS 9.9*  --   --   --   HGB 14.1 14.1 12.3 12.5  HCT 42.0 42.0 36.0 36.9  MCV 90.7 89.9 88.2 88.3  PLT 198 148* 141* [id number]   Basic Metabolic Panel:  Recent Labs Lab 08/23/15 1956 08/24/15 0334 08/25/15 0807 08/26/15 0607  NA 132* 137 140 139  K 3.6 3.2* 3.0* 3.0*  CL 96* 104 109 108  CO2 26 23 26 24   GLUCOSE 154* 166* 129* 125*  BUN 13 9 7 8   CREATININE 0.98 0.77 0.72 0.66  CALCIUM 9.1 8.2* 8.0* 8.4*   GFR: Estimated  Creatinine Clearance: 63.6 mL/min (by C-G formula based on SCr of 0.8 mg/dL). Liver Function Tests:  Recent Labs Lab 08/23/15 1956  AST 14*  ALT 11*  ALKPHOS 65  BILITOT 1.7*  PROT 8.2*  ALBUMIN 4.2   No results for input(s): LIPASE, AMYLASE in the last 168 hours. No results for input(s): AMMONIA in the last 168 hours. Coagulation Profile:  Recent Labs Lab 08/24/15 0048  INR 1.17   Cardiac Enzymes: No results for input(s): CKTOTAL, CKMB, CKMBINDEX, TROPONINI in the last 168 hours. BNP (last 3 results) No results for input(s): PROBNP in the last 8760 hours. HbA1C:  Recent Labs  08/24/15 0048  HGBA1C 6.1*   CBG:  Recent Labs Lab 08/25/15 0805 08/25/15 1236 08/25/15 1753 08/25/15 2146 08/26/15 0754  GLUCAP 124* 127* 132* 178* 120*   Lipid Profile: No results for input(s): CHOL, HDL, LDLCALC, TRIG, CHOLHDL, LDLDIRECT in the last 72 hours. Thyroid Function Tests: No results for input(s): TSH, T4TOTAL, FREET4, T3FREE, THYROIDAB in the last 72 hours. Anemia Panel: No results for input(s): VITAMINB12, FOLATE, FERRITIN, TIBC, IRON, RETICCTPCT in the last 72 hours. Urine analysis:    Component Value Date/Time   COLORURINE YELLOW 08/23/2015 1920   APPEARANCEUR CLOUDY (A) 08/23/2015 1920   LABSPEC 1.017 08/23/2015 1920   PHURINE 5.0 08/23/2015 1920   GLUCOSEU NEGATIVE 08/23/2015 1920   HGBUR SMALL (A) 08/23/2015 1920   BILIRUBINUR NEGATIVE 08/23/2015 1920   KETONESUR NEGATIVE 08/23/2015 1920   PROTEINUR NEGATIVE 08/23/2015 1920   NITRITE POSITIVE (A) 08/23/2015 1920   LEUKOCYTESUR SMALL (A) 08/23/2015 1920     ) Recent Results (from the past 240 hour(s))  Urine culture     Status: Abnormal   Collection Time: 08/23/15  7:20 PM  Result Value Ref Range Status   Specimen Description URINE, CLEAN CATCH  Final   Special Requests NONE  Final   Culture 70,000 COLONIES/mL ESCHERICHIA COLI (A)  Final   Report Status 08/26/2015 FINAL  Final   Organism ID, Bacteria  ESCHERICHIA COLI (A)  Final      Susceptibility   Escherichia coli - MIC*    AMPICILLIN 4 SENSITIVE Sensitive     CEFAZOLIN <=4 SENSITIVE Sensitive     CEFTRIAXONE <=1 SENSITIVE Sensitive     CIPROFLOXACIN <=0.25 SENSITIVE Sensitive     GENTAMICIN <=1 SENSITIVE Sensitive     IMIPENEM <=0.25 SENSITIVE Sensitive     NITROFURANTOIN <=16 SENSITIVE Sensitive     TRIMETH/SULFA <=20 SENSITIVE Sensitive     AMPICILLIN/SULBACTAM <=2 SENSITIVE Sensitive     PIP/TAZO <=4 SENSITIVE Sensitive     Extended ESBL NEGATIVE  Sensitive     * 70,000 COLONIES/mL ESCHERICHIA COLI  Blood Culture (routine x 2)     Status: None (Preliminary result)   Collection Time: 08/23/15  7:56 PM  Result Value Ref Range Status   Specimen Description BLOOD RIGHT ANTECUBITAL  Final   Special Requests BOTTLES DRAWN AEROBIC AND ANAEROBIC 5CC  Final   Culture   Final    NO GROWTH 2 DAYS Performed at [facility]    Report Status PENDING  Incomplete  Blood Culture (routine x 2)     Status: Abnormal (Preliminary result)   Collection Time: 08/23/15  8:10 PM  Result Value Ref Range Status   Specimen Description BLOOD BLOOD LEFT FOREARM  Final   Special Requests BOTTLES DRAWN AEROBIC AND ANAEROBIC 5CC  Final   Culture  Setup Time   Final    GRAM NEGATIVE RODS AEROBIC BOTTLE ONLY CRITICAL RESULT CALLED TO, READ BACK BY AND VERIFIED WITH: B GREEN,PHARMD AT 1100 08/25/15 BY L BENFIELD    Culture (A)  Final    ESCHERICHIA COLI SUSCEPTIBILITIES TO FOLLOW Performed at [facility]    Report Status PENDING  Incomplete  Blood Culture ID Panel (Reflexed)     Status: Abnormal   Collection Time: 08/23/15  8:10 PM  Result Value Ref Range Status   Enterococcus species NOT DETECTED NOT DETECTED Final   Vancomycin resistance NOT DETECTED NOT DETECTED Final   Listeria monocytogenes NOT DETECTED NOT DETECTED Final   Staphylococcus species NOT DETECTED NOT DETECTED Final   Staphylococcus aureus NOT DETECTED NOT DETECTED  Final   Methicillin resistance NOT DETECTED NOT DETECTED Final   Streptococcus species NOT DETECTED NOT DETECTED Final   Streptococcus agalactiae NOT DETECTED NOT DETECTED Final   Streptococcus pneumoniae NOT DETECTED NOT DETECTED Final   Streptococcus pyogenes NOT DETECTED NOT DETECTED Final   Acinetobacter baumannii NOT DETECTED NOT DETECTED Final   Enterobacteriaceae species DETECTED (A) NOT DETECTED Final    Comment: CRITICAL RESULT CALLED TO, READ BACK BY AND VERIFIED WITH: B GREEN,PHARMD AT 1100 08/25/15 BY L BENFIELD    Enterobacter cloacae complex NOT DETECTED NOT DETECTED Final   Escherichia coli DETECTED (A) NOT DETECTED Final    Comment: CRITICAL RESULT CALLED TO, READ BACK BY AND VERIFIED WITH: B GREEN,PHARMD AT 1100 08/25/15 BY L BENFIELD    Klebsiella oxytoca NOT DETECTED NOT DETECTED Final   Klebsiella pneumoniae NOT DETECTED NOT DETECTED Final   Proteus species NOT DETECTED NOT DETECTED Final   Serratia marcescens NOT DETECTED NOT DETECTED Final   Carbapenem resistance NOT DETECTED NOT DETECTED Final   Haemophilus influenzae NOT DETECTED NOT DETECTED Final   Neisseria meningitidis NOT DETECTED NOT DETECTED Final   Pseudomonas aeruginosa NOT DETECTED NOT DETECTED Final   Candida albicans NOT DETECTED NOT DETECTED Final   Candida glabrata NOT DETECTED NOT DETECTED Final   Candida krusei NOT DETECTED NOT DETECTED Final   Candida parapsilosis NOT DETECTED NOT DETECTED Final   Candida tropicalis NOT DETECTED NOT DETECTED Final    Comment: Performed at [place]    Start     Dose/Rate Route Frequency Ordered Stop   08/25/15 1200  cefTRIAXone (ROCEPHIN) 2 g in dextrose 5 % 50 mL IVPB     2 g 100 mL/hr over 30 Minutes Intravenous Every 24 hours 08/25/15 1113     08/24/15 2200  cefTRIAXone (ROCEPHIN) 2 g in dextrose 5 % 50 mL IVPB  Status:  Discontinued  2 g 100 mL/hr over 30 Minutes Intravenous Every 24 hours 08/24/15 0037 08/24/15 1505     08/24/15 1600  piperacillin-tazobactam (ZOSYN) IVPB 3.375 g  Status:  Discontinued     3.375 g 12.5 mL/hr over 240 Minutes Intravenous Every 8 hours 08/24/15 1515 08/25/15 1113   08/24/15 1600  vancomycin (VANCOCIN) 500 mg in sodium chloride 0.9 % 100 mL IVPB  Status:  Discontinued     500 mg 100 mL/hr over 60 Minutes Intravenous Every 12 hours 08/24/15 1515 08/25/15 1112   08/24/15 1000  vancomycin (VANCOCIN) 500 mg in sodium chloride 0.9 % 100 mL IVPB  Status:  Discontinued     500 mg 100 mL/hr over 60 Minutes Intravenous Every 12 hours 08/23/15 2125 08/24/15 0013   08/24/15 0400  piperacillin-tazobactam (ZOSYN) IVPB 3.375 g  Status:  Discontinued     3.375 g 12.5 mL/hr over 240 Minutes Intravenous Every 8 hours 08/23/15 2125 08/24/15 0013   08/24/15 0015  cefTRIAXone (ROCEPHIN) 2 g in dextrose 5 % 50 mL IVPB     2 g 100 mL/hr over 30 Minutes Intravenous  Once 08/24/15 0013 08/24/15 0127   08/23/15 1930  piperacillin-tazobactam (ZOSYN) IVPB 3.375 g     3.375 g 100 mL/hr over 30 Minutes Intravenous  Once 08/23/15 1925 08/23/15 2100   08/23/15 1930  vancomycin (VANCOCIN) IVPB 1000 mg/200 mL premix     1,000 mg 200 mL/hr over 60 Minutes Intravenous  Once 08/23/15 1925 08/23/15 2207       Radiology Studies: No results found.      Scheduled Meds: . amLODipine  10 mg Oral Daily  . aspirin EC  81 mg Oral QHS  . atorvastatin  20 mg Oral Daily  . cefTRIAXone (ROCEPHIN)  IV  2 g Intravenous Q24H  . donepezil  23 mg Oral QHS  . enoxaparin (LOVENOX) injection  40 mg Subcutaneous QHS  . escitalopram  10 mg Oral Daily  . insulin aspart  0-15 Units Subcutaneous TID WC  . levothyroxine  75 mcg Oral QAC breakfast  . lisinopril  40 mg Oral Daily  . memantine  28 mg Oral Daily  . sodium chloride flush  3 mL Intravenous Q12H   Continuous Infusions: . sodium chloride 1,000 mL (08/26/15 0819)     LOS: 3 days    Time spent: 35 min    [place], DO [facility] Pager [phone]  If 7PM-7AM, please contact night-coverage [email or web address] Password [email or web address] 08/26/2015, 11:24 AM

## 2015-08-26 NOTE — Progress Notes (Signed)
Nutrition Brief Note  Patient identified on the Malnutrition Screening Tool (MST) Report  Patient with insignificant weight loss, 5 lbs over 5 months is 3% x 5 months. Patient is currently eating 50-100% of meals.  Wt Readings from Last 15 Encounters:  08/24/15 146 lb (66.2 kg)  09/06/14 157 lb (71.2 kg)  02/24/14 160 lb (72.6 kg)  08/24/13 154 lb (69.9 kg)  02/24/13 173 lb (78.5 kg)  08/19/12 166 lb (75.3 kg)  06/23/11 158 lb (71.7 kg)    Body mass index is 22.87 kg/m. Patient meets criteria for normal range based on current BMI.   Current diet order is Heart Healthy/CHO modified, patient is consuming approximately 50-100% of meals at this time. Labs and medications reviewed.   No nutrition interventions warranted at this time. If nutrition issues arise, please consult RD.   Clayton Bibles, MS, RD, LDN Pager: [phone] After Hours Pager: [phone]

## 2015-08-27 DIAGNOSIS — E039 Hypothyroidism, unspecified: Secondary | ICD-10-CM

## 2015-08-27 DIAGNOSIS — N390 Urinary tract infection, site not specified: Secondary | ICD-10-CM

## 2015-08-27 DIAGNOSIS — R7881 Bacteremia: Secondary | ICD-10-CM

## 2015-08-27 LAB — CBC
HCT: 38.4 % (ref 36.0–46.0)
HEMOGLOBIN: 12.8 g/dL (ref 12.0–15.0)
MCH: 29.3 pg (ref 26.0–34.0)
MCHC: 33.3 g/dL (ref 30.0–36.0)
MCV: 87.9 fL (ref 78.0–100.0)
Platelets: 233 10*3/uL (ref 150–400)
RBC: 4.37 MIL/uL (ref 3.87–5.11)
RDW: 12.9 % (ref 11.5–15.5)
WBC: 3.7 10*3/uL — AB (ref 4.0–10.5)

## 2015-08-27 LAB — CULTURE, BLOOD (ROUTINE X 2)

## 2015-08-27 LAB — BASIC METABOLIC PANEL
ANION GAP: 7 (ref 5–15)
BUN: 7 mg/dL (ref 6–20)
CALCIUM: 8.6 mg/dL — AB (ref 8.9–10.3)
CO2: 26 mmol/L (ref 22–32)
CREATININE: 0.65 mg/dL (ref 0.44–1.00)
Chloride: 105 mmol/L (ref 101–111)
GFR calc non Af Amer: >60 mL/min (ref >60)
Glucose, Bld: 128 mg/dL — ABNORMAL HIGH (ref 65–99)
Potassium: 3.3 mmol/L — ABNORMAL LOW (ref 3.5–5.1)
SODIUM: 138 mmol/L (ref 135–145)

## 2015-08-27 LAB — GLUCOSE, CAPILLARY
GLUCOSE-CAPILLARY: 146 mg/dL — AB (ref 65–99)
GLUCOSE-CAPILLARY: 184 mg/dL — AB (ref 65–99)
Glucose-Capillary: 184 mg/dL — ABNORMAL HIGH (ref 65–99)
Glucose-Capillary: 134 mg/dL — ABNORMAL HIGH (ref 65–99)

## 2015-08-27 MED ORDER — POTASSIUM CHLORIDE CRYS ER 20 MEQ PO TBCR: 40.0000 meq | EXTENDED_RELEASE_TABLET | Freq: Once | ORAL | Status: DC

## 2015-08-27 MED ORDER — CIPROFLOXACIN HCL 500 MG PO TABS
500.0000 mg | ORAL_TABLET | Freq: Two times a day (BID) | ORAL | Status: DC
  Administered 2015-08-27 – 2015-08-28 (×3): 500 mg via ORAL
  Filled 2015-08-27 (×3): qty 1

## 2015-08-27 MED ORDER — SACCHAROMYCES BOULARDII 250 MG PO CAPS
250.0000 mg | ORAL_CAPSULE | Freq: Two times a day (BID) | ORAL | Status: DC
  Administered 2015-08-27 – 2015-08-28 (×3): 250 mg via ORAL
  Filled 2015-08-27 (×3): qty 1

## 2015-08-27 MED ORDER — METRONIDAZOLE 500 MG PO TABS
500.0000 mg | ORAL_TABLET | Freq: Three times a day (TID) | ORAL | Status: DC
  Administered 2015-08-27 – 2015-08-28 (×4): 500 mg via ORAL
  Filled 2015-08-27 (×5): qty 1

## 2015-08-27 MED ORDER — POTASSIUM CHLORIDE CRYS ER 20 MEQ PO TBCR
40.0000 meq | EXTENDED_RELEASE_TABLET | Freq: Once | ORAL | Status: AC
  Administered 2015-08-27: 40 meq via ORAL
  Filled 2015-08-27: qty 2

## 2015-08-27 NOTE — Progress Notes (Signed)
Physical Therapy Treatment Patient Details Name: Frances [name] MRN: [id number] DOB: 1945/07/04 Today's Date: 09-09-2015    History of Present Illness Patient is a 70 yo female admitted 08/23/15 with weakness, AMS, sepsis, UTI.     PMH:  HTN, DM, dementia    PT Comments    Pt assisted with ambulated and able to improve distance today.  Pt requiring min assist during gait for occasional R lateral lean.  Follow Up Recommendations  Supervision/Assistance - 24 hour;No PT follow up     Equipment Recommendations  3in1 (PT)    Recommendations for Other Services       Precautions / Restrictions Precautions Precautions: Fall    Mobility  Bed Mobility Overal bed mobility: Needs Assistance Bed Mobility: Supine to Sit;Sit to Supine     Supine to sit: Min guard Sit to supine: Min guard   General bed mobility comments: Verbal and tactile cues provided however no physical assist required  Transfers Overall transfer level: Needs assistance Equipment used: None Transfers: Sit to/from Stand Sit to Stand: Min guard         General transfer comment: min/guard for safety  Ambulation/Gait Ambulation/Gait assistance: Min assist Ambulation Distance (Feet): 160 Feet Assistive device: 1 person hand held assist Gait Pattern/deviations: Step-through pattern;Narrow base of support     General Gait Details: LOB to R side occasionally requiring min assist   Stairs            Wheelchair Mobility    Modified Rankin (Stroke Patients Only)       Balance                                    Cognition Arousal/Alertness: Awake/alert Behavior During Therapy: Restless Overall Cognitive Status: History of cognitive impairments - at baseline                      Exercises      General Comments        Pertinent Vitals/Pain Pain Assessment: No/denies pain    Home Living                      Prior Function            PT Goals  (current goals can now be found in the care plan section) Progress towards PT goals: Progressing toward goals    Frequency  Min 3X/week    PT Plan Current plan remains appropriate    Co-evaluation             End of Session   Activity Tolerance: Patient tolerated treatment well Patient left: in bed;with call bell/phone within reach;with bed alarm set;with family/visitor present     Time: [id number] PT Time Calculation (min) (ACUTE ONLY): 11 min  Charges:  $Gait Training: 8-22 mins                    G Codes:      Unita Detamore,KATHrine E 2015/09/09, 3:27 PM Carmelia Bake, PT, DPT 2015-09-09 Pager: [phone]

## 2015-08-27 NOTE — Progress Notes (Signed)
PROGRESS NOTE    Frances [name]  [id number] DOB: [birth year] DOA: 08/23/2015 PCP: Mathews Argyle, MD   Outpatient Specialists:    Brief Narrative:  Frances [name] is a 70 y.o. woman with a history of HTN, DM, and dementia who presents to the ED for evaluation of 3 days of progressive generalized weakness and mental status changes.  Her POA/primary caregiver Frances [name] and her sister Frances [name] are present at bedside.  Most of the clinical history is provided by [facility].  The patient normally engages in simple conversation at baseline though she is not oriented to time or date.  Family has noted that she less conversant with decreased PO intake for the past few days.  She has had subjective fevers with chills and sweats.  She had a single episode of nause and vomiting yesterday; it was nonbloody.  No diarrhea.  No chest, back, or abdominal pain.  No dysuria.  She has urinary incontinence at baseline and wears Depends.  However, family also reports that she is not to hold her urine for multiple hours at a time.  She can ambulate independently at baseline.  No history of falls, trauma, or LOC.   Assessment & Plan:   Active Problems:   Urinary incontinence   Hypothyroid   Dementia   Diabetes (HCC)   Sepsis (HCC)   UTI (lower urinary tract infection)   Leukocytosis   Fever   Altered mental status   Sepsis secondary to UTI and bacteremia-- E COLI --IV Rocephin- changed to PO cipro continues to spike temperatures- u/s normal  Lactic acidosis -recheck was normal  Hypokalemia -repleted Recheck in AM  AMS --appears to be at baseline  HTN --Continue amlodipine and fosinopril  DM --Hold metformin --SSI coverage AC/HS  History of dementia --Continue home doses of aricept and namenda - ambulates per caregiver but only speaks a few words  Depression --Continue home dose of lexapro  Hypothyroidism --Continue home dose of levothyroxine   DVT prophylaxis:    Lovenox   Code Status: Full Code   Family Communication: Sister at bedside  Disposition Plan:  Back home once fever gone for 24 hours   Consultants:     Procedures:        Subjective: Temperature last PM again Eating well No complaints  Objective: Vitals:   08/26/15 1500 08/26/15 2037 08/26/15 2312 08/27/15 0507  BP: (!) 162/51 136/75  130/65  Pulse: 82 93  86  Resp: 18   16  Temp: (!) 100.6 F (38.1 C) (!) 102.5 F (39.2 C) 99.7 F (37.6 C) 98.6 F (37 C)  TempSrc: Rectal Rectal Rectal Rectal  SpO2: 99% 98%  98%  Weight:      Height:        Intake/Output Summary (Last 24 hours) at 08/27/15 1023 Last data filed at 08/27/15 0700  Gross per 24 hour  Intake             2285 ml  Output             3000 ml  Net             -715 ml   Filed Weights   08/23/15 1947 08/24/15 0022  Weight: 66.2 kg (146 lb) 66.2 kg (146 lb)    Examination:  General exam: Appears calm and comfortable  Respiratory system: Clear to auscultation. Respiratory effort normal. Cardiovascular system: S1 & S2 heard, RRR. No JVD, murmurs, rubs, gallops or clicks. No pedal edema. Gastrointestinal system: Abdomen is  nondistended, soft and nontender. No organomegaly or masses felt. Normal bowel sounds heard. Skin: no breakdown    Data Reviewed: I have personally reviewed following labs and imaging studies  CBC:  Recent Labs Lab 08/23/15 1956 08/24/15 0334 08/25/15 0807 08/26/15 0607 08/27/15 0509  WBC 11.7* 9.1 4.3 3.1* 3.7*  NEUTROABS 9.9*  --   --   --   --   HGB 14.1 14.1 12.3 12.5 12.8  HCT 42.0 42.0 36.0 36.9 38.4  MCV 90.7 89.9 88.2 88.3 87.9  PLT 198 148* 141* 180 0000000   Basic Metabolic Panel:  Recent Labs Lab 08/23/15 1956 08/24/15 0334 08/25/15 0807 08/26/15 0607 08/27/15 0509  NA 132* 137 140 139 138  K 3.6 3.2* 3.0* 3.0* 3.3*  CL 96* 104 109 108 105  CO2 26 23 26 24 26   GLUCOSE 154* 166* 129* 125* 128*  BUN 13 9 7 8 7   CREATININE 0.98 0.77 0.72  0.66 0.65  CALCIUM 9.1 8.2* 8.0* 8.4* 8.6*   GFR: Estimated Creatinine Clearance: 63.6 mL/min (by C-G formula based on SCr of 0.8 mg/dL). Liver Function Tests:  Recent Labs Lab 08/23/15 1956  AST 14*  ALT 11*  ALKPHOS 65  BILITOT 1.7*  PROT 8.2*  ALBUMIN 4.2   No results for input(s): LIPASE, AMYLASE in the last 168 hours. No results for input(s): AMMONIA in the last 168 hours. Coagulation Profile:  Recent Labs Lab 08/24/15 0048  INR 1.17   Cardiac Enzymes: No results for input(s): CKTOTAL, CKMB, CKMBINDEX, TROPONINI in the last 168 hours. BNP (last 3 results) No results for input(s): PROBNP in the last 8760 hours. HbA1C: No results for input(s): HGBA1C in the last 72 hours. CBG:  Recent Labs Lab 08/26/15 0754 08/26/15 1155 08/26/15 1637 08/26/15 2029 08/27/15 0809  GLUCAP 120* 199* 321* 126* 146*   Lipid Profile: No results for input(s): CHOL, HDL, LDLCALC, TRIG, CHOLHDL, LDLDIRECT in the last 72 hours. Thyroid Function Tests: No results for input(s): TSH, T4TOTAL, FREET4, T3FREE, THYROIDAB in the last 72 hours. Anemia Panel: No results for input(s): VITAMINB12, FOLATE, FERRITIN, TIBC, IRON, RETICCTPCT in the last 72 hours. Urine analysis:    Component Value Date/Time   COLORURINE YELLOW 08/23/2015 1920   APPEARANCEUR CLOUDY (A) 08/23/2015 1920   LABSPEC 1.017 08/23/2015 1920   PHURINE 5.0 08/23/2015 1920   GLUCOSEU NEGATIVE 08/23/2015 1920   HGBUR SMALL (A) 08/23/2015 1920   BILIRUBINUR NEGATIVE 08/23/2015 1920   KETONESUR NEGATIVE 08/23/2015 1920   PROTEINUR NEGATIVE 08/23/2015 1920   NITRITE POSITIVE (A) 08/23/2015 1920   LEUKOCYTESUR SMALL (A) 08/23/2015 1920     ) Recent Results (from the past 240 hour(s))  Urine culture     Status: Abnormal   Collection Time: 08/23/15  7:20 PM  Result Value Ref Range Status   Specimen Description URINE, CLEAN CATCH  Final   Special Requests NONE  Final   Culture 70,000 COLONIES/mL ESCHERICHIA COLI (A)   Final   Report Status 08/26/2015 FINAL  Final   Organism ID, Bacteria ESCHERICHIA COLI (A)  Final      Susceptibility   Escherichia coli - MIC*    AMPICILLIN 4 SENSITIVE Sensitive     CEFAZOLIN <=4 SENSITIVE Sensitive     CEFTRIAXONE <=1 SENSITIVE Sensitive     CIPROFLOXACIN <=0.25 SENSITIVE Sensitive     GENTAMICIN <=1 SENSITIVE Sensitive     IMIPENEM <=0.25 SENSITIVE Sensitive     NITROFURANTOIN <=16 SENSITIVE Sensitive     TRIMETH/SULFA <=20 SENSITIVE Sensitive  AMPICILLIN/SULBACTAM <=2 SENSITIVE Sensitive     PIP/TAZO <=4 SENSITIVE Sensitive     Extended ESBL NEGATIVE Sensitive     * 70,000 COLONIES/mL ESCHERICHIA COLI  Blood Culture (routine x 2)     Status: None (Preliminary result)   Collection Time: 08/23/15  7:56 PM  Result Value Ref Range Status   Specimen Description BLOOD RIGHT ANTECUBITAL  Final   Special Requests BOTTLES DRAWN AEROBIC AND ANAEROBIC 5CC  Final   Culture  Setup Time   Final    GRAM NEGATIVE RODS AEROBIC BOTTLE ONLY CRITICAL VALUE NOTED.  VALUE IS CONSISTENT WITH PREVIOUSLY REPORTED AND CALLED VALUE. Performed at [place]  Final   Report Status PENDING  Incomplete  Blood Culture (routine x 2)     Status: Abnormal   Collection Time: 08/23/15  8:10 PM  Result Value Ref Range Status   Specimen Description BLOOD BLOOD LEFT FOREARM  Final   Special Requests BOTTLES DRAWN AEROBIC AND ANAEROBIC 5CC  Final   Culture  Setup Time   Final    GRAM NEGATIVE RODS AEROBIC BOTTLE ONLY CRITICAL RESULT CALLED TO, READ BACK BY AND VERIFIED WITH: B GREEN,PHARMD AT 1100 08/25/15 BY L BENFIELD Performed at [place] (A)  Final   Report Status 08/27/2015 FINAL  Final   Organism ID, Bacteria ESCHERICHIA COLI  Final      Susceptibility   Escherichia coli - MIC*    AMPICILLIN <=2 SENSITIVE Sensitive     CEFAZOLIN <=4 SENSITIVE Sensitive     CEFEPIME <=1 SENSITIVE Sensitive      CEFTAZIDIME <=1 SENSITIVE Sensitive     CEFTRIAXONE <=1 SENSITIVE Sensitive     CIPROFLOXACIN <=0.25 SENSITIVE Sensitive     GENTAMICIN <=1 SENSITIVE Sensitive     IMIPENEM <=0.25 SENSITIVE Sensitive     TRIMETH/SULFA <=20 SENSITIVE Sensitive     AMPICILLIN/SULBACTAM <=2 SENSITIVE Sensitive     PIP/TAZO <=4 SENSITIVE Sensitive     Extended ESBL NEGATIVE Sensitive     * ESCHERICHIA COLI  Blood Culture ID Panel (Reflexed)     Status: Abnormal   Collection Time: 08/23/15  8:10 PM  Result Value Ref Range Status   Enterococcus species NOT DETECTED NOT DETECTED Final   Vancomycin resistance NOT DETECTED NOT DETECTED Final   Listeria monocytogenes NOT DETECTED NOT DETECTED Final   Staphylococcus species NOT DETECTED NOT DETECTED Final   Staphylococcus aureus NOT DETECTED NOT DETECTED Final   Methicillin resistance NOT DETECTED NOT DETECTED Final   Streptococcus species NOT DETECTED NOT DETECTED Final   Streptococcus agalactiae NOT DETECTED NOT DETECTED Final   Streptococcus pneumoniae NOT DETECTED NOT DETECTED Final   Streptococcus pyogenes NOT DETECTED NOT DETECTED Final   Acinetobacter baumannii NOT DETECTED NOT DETECTED Final   Enterobacteriaceae species DETECTED (A) NOT DETECTED Final    Comment: CRITICAL RESULT CALLED TO, READ BACK BY AND VERIFIED WITH: B GREEN,PHARMD AT 1100 08/25/15 BY L BENFIELD    Enterobacter cloacae complex NOT DETECTED NOT DETECTED Final   Escherichia coli DETECTED (A) NOT DETECTED Final    Comment: CRITICAL RESULT CALLED TO, READ BACK BY AND VERIFIED WITH: B GREEN,PHARMD AT 1100 08/25/15 BY L BENFIELD    Klebsiella oxytoca NOT DETECTED NOT DETECTED Final   Klebsiella pneumoniae NOT DETECTED NOT DETECTED Final   Proteus species NOT DETECTED NOT DETECTED Final   Serratia marcescens NOT DETECTED NOT DETECTED Final   Carbapenem resistance NOT  DETECTED NOT DETECTED Final   Haemophilus influenzae NOT DETECTED NOT DETECTED Final   Neisseria meningitidis NOT  DETECTED NOT DETECTED Final   Pseudomonas aeruginosa NOT DETECTED NOT DETECTED Final   Candida albicans NOT DETECTED NOT DETECTED Final   Candida glabrata NOT DETECTED NOT DETECTED Final   Candida krusei NOT DETECTED NOT DETECTED Final   Candida parapsilosis NOT DETECTED NOT DETECTED Final   Candida tropicalis NOT DETECTED NOT DETECTED Final    Comment: Performed at [place]    Start     Dose/Rate Route Frequency Ordered Stop   08/27/15 1400  metroNIDAZOLE (FLAGYL) tablet 500 mg     500 mg Oral Every 8 hours 08/27/15 1019     08/27/15 1200  ciprofloxacin (CIPRO) tablet 500 mg     500 mg Oral 2 times daily 08/27/15 1020     08/25/15 1200  cefTRIAXone (ROCEPHIN) 2 g in dextrose 5 % 50 mL IVPB  Status:  Discontinued     2 g 100 mL/hr over 30 Minutes Intravenous Every 24 hours 08/25/15 1113 08/27/15 1020   08/24/15 2200  cefTRIAXone (ROCEPHIN) 2 g in dextrose 5 % 50 mL IVPB  Status:  Discontinued     2 g 100 mL/hr over 30 Minutes Intravenous Every 24 hours 08/24/15 0037 08/24/15 1505   08/24/15 1600  piperacillin-tazobactam (ZOSYN) IVPB 3.375 g  Status:  Discontinued     3.375 g 12.5 mL/hr over 240 Minutes Intravenous Every 8 hours 08/24/15 1515 08/25/15 1113   08/24/15 1600  vancomycin (VANCOCIN) 500 mg in sodium chloride 0.9 % 100 mL IVPB  Status:  Discontinued     500 mg 100 mL/hr over 60 Minutes Intravenous Every 12 hours 08/24/15 1515 08/25/15 1112   08/24/15 1000  vancomycin (VANCOCIN) 500 mg in sodium chloride 0.9 % 100 mL IVPB  Status:  Discontinued     500 mg 100 mL/hr over 60 Minutes Intravenous Every 12 hours 08/23/15 2125 08/24/15 0013   08/24/15 0400  piperacillin-tazobactam (ZOSYN) IVPB 3.375 g  Status:  Discontinued     3.375 g 12.5 mL/hr over 240 Minutes Intravenous Every 8 hours 08/23/15 2125 08/24/15 0013   08/24/15 0015  cefTRIAXone (ROCEPHIN) 2 g in dextrose 5 % 50 mL IVPB     2 g 100 mL/hr over 30 Minutes Intravenous  Once 08/24/15  0013 08/24/15 0127   08/23/15 1930  piperacillin-tazobactam (ZOSYN) IVPB 3.375 g     3.375 g 100 mL/hr over 30 Minutes Intravenous  Once 08/23/15 1925 08/23/15 2100   08/23/15 1930  vancomycin (VANCOCIN) IVPB 1000 mg/200 mL premix     1,000 mg 200 mL/hr over 60 Minutes Intravenous  Once 08/23/15 1925 08/23/15 2207       Radiology Studies: US Renal  Result Date: 08/26/2015 CLINICAL DATA:  Urinary incontinence and sepsis.  Fever EXAM: RENAL / URINARY TRACT ULTRASOUND COMPLETE COMPARISON:  None. FINDINGS: Right Kidney: Length: 11 cm. Echogenicity within normal limits. No solid mass or hydronephrosis visualized. 27 mm hilar cyst. Left Kidney: Length: 11 cm. Echogenicity within normal limits. No mass or hydronephrosis visualized. Bladder: Appears normal for degree of bladder distention. No internal debris. IMPRESSION: 1. No acute or significant finding. 2. Right renal cyst. Electronically Signed   By: Monte Fantasia M.D.   On: 08/26/2015 21:59        Scheduled Meds: . amLODipine  10 mg Oral Daily  . aspirin EC  81 mg Oral QHS  .  atorvastatin  20 mg Oral Daily  . ciprofloxacin  500 mg Oral BID  . donepezil  23 mg Oral QHS  . enoxaparin (LOVENOX) injection  40 mg Subcutaneous QHS  . escitalopram  10 mg Oral Daily  . insulin aspart  0-15 Units Subcutaneous TID WC  . levothyroxine  75 mcg Oral QAC breakfast  . lisinopril  40 mg Oral Daily  . memantine  28 mg Oral Daily  . metroNIDAZOLE  500 mg Oral Q8H  . potassium chloride  40 mEq Oral Once  . saccharomyces boulardii  250 mg Oral BID  . sodium chloride flush  3 mL Intravenous Q12H   Continuous Infusions:     LOS: 4 days    Time spent: 25 min    [place], DO [facility] Pager [phone]  If 7PM-7AM, please contact night-coverage [email or web address] Password [facility] 08/27/2015, 10:23 AM

## 2015-08-27 NOTE — Care Management Important Message (Signed)
Important Message  Patient Details  Name: Frances [name] MRN: [id number] Date of Birth: 08/12/1945   Medicare Important Message Given:  Yes    [name] 08/27/2015, 10:26 AMImportant Message  Patient Details  Name: Frances [name] MRN: [id number] Date of Birth: 1945/04/10   Medicare Important Message Given:  Yes    [name] 08/27/2015, 10:26 AM

## 2015-08-28 LAB — QUANTIFERON TB GOLD ASSAY (BLOOD)

## 2015-08-28 LAB — QUANTIFERON IN TUBE
QFT TB AG MINUS NIL VALUE: <0 IU/mL
QUANTIFERON MITOGEN VALUE: 4.73 IU/mL
QUANTIFERON TB AG VALUE: 1.87 [IU]/mL
QUANTIFERON TB GOLD: NEGATIVE
Quantiferon Nil Value: 1.98 IU/mL

## 2015-08-28 LAB — GLUCOSE, CAPILLARY
GLUCOSE-CAPILLARY: 169 mg/dL — AB (ref 65–99)
GLUCOSE-CAPILLARY: 309 mg/dL — AB (ref 65–99)

## 2015-08-28 MED ORDER — METRONIDAZOLE 500 MG PO TABS: 500.0000 mg | ORAL_TABLET | Freq: Three times a day (TID) | ORAL | 0 refills | Status: DC

## 2015-08-28 MED ORDER — ACETAMINOPHEN 325 MG PO TABS: 650.0000 mg | ORAL_TABLET | ORAL | Status: DC | PRN

## 2015-08-28 MED ORDER — CIPROFLOXACIN HCL 500 MG PO TABS: 500.0000 mg | ORAL_TABLET | Freq: Two times a day (BID) | ORAL | 0 refills | Status: DC

## 2015-08-28 MED ORDER — SACCHAROMYCES BOULARDII 250 MG PO CAPS: 250.0000 mg | ORAL_CAPSULE | Freq: Two times a day (BID) | ORAL | 0 refills | Status: DC

## 2015-08-28 NOTE — Progress Notes (Signed)
Discharge instructions reviewed with family mbrs friend. Questions concerns denied. Friend to schedule post DC f/u visit.

## 2015-08-28 NOTE — Discharge Summary (Signed)
Physician Discharge Summary  [facility] [name] [id number] DOB: 1945/12/08 DOA: 08/23/2015  PCP: Mathews Argyle, MD  Admit date: 08/23/2015 Discharge date: 08/28/2015   Recommendations for Outpatient Follow-Up:   1. Finish cipro course 2. Home with 24 hour supervision   Discharge Diagnosis:   Active Problems:   Urinary incontinence   Hypothyroid   Dementia   Diabetes ([place])   Sepsis ([place])   UTI (lower urinary tract infection)   Leukocytosis   Fever   Altered mental status   Bacteremia   Discharge disposition:  Home  Discharge Condition: Improved.  Diet: Carbohydrate-modified  Wound care: None.   History of Present Illness:   Frances [name] is a 70 y.o. woman with a history of HTN, DM, and dementia who presents to the ED for evaluation of 3 days of progressive generalized weakness and mental status changes.  Her POA/primary caregiver [name] and her sister [name] are present at bedside.  Most of the clinical history is provided by [facility].  The patient normally engages in simple conversation at baseline though she is not oriented to time or date.  Family has noted that she less conversant with decreased PO intake for the past few days.  She has had subjective fevers with chills and sweats.  She had a single episode of nause and vomiting yesterday; it was nonbloody.  No diarrhea.  No chest, back, or abdominal pain.  No dysuria.  She has urinary incontinence at baseline and wears Depends.  However, family also reports that she is not to hold her urine for multiple hours at a time.  She can ambulate independently at baseline.  No history of falls, trauma, or LOC.   Hospital Course by Problem:   Sepsis secondary to UTI and bacteremia-- E COLI --IV Rocephin- changed to PO cipro and finish course -added flagyl at pharmacy rec for loose stools (appearance was too formed to send for c diff) -added florastar as well -renal U/S normal  Fever free for >24  hours  Lactic acidosis -recheck was normal  Hypokalemia -repleted  AMS --resolved, back to baseline  HTN --Continue amlodipine and fosinopril  DM --resume home meds  History of dementia --Continue home doses of aricept and namenda - ambulates per caregiver but only speaks a few words  Depression --Continue home dose of lexapro  Hypothyroidism --Continue home dose of levothyroxine    Medical Consultants:    None.   Discharge Exam:   Vitals:   08/28/15 0508 08/28/15 1100  BP: 123/73 110/62  Pulse: 76   Resp: 18   Temp: 99.1 F (37.3 C)    Vitals:   08/27/15 1500 08/27/15 2101 08/28/15 0508 08/28/15 1100  BP: 129/70 125/76 123/73 110/62  Pulse: 80 92 76   Resp: 18 18 18    Temp: 98.8 F (37.1 C) 99.7 F (37.6 C) 99.1 F (37.3 C)   TempSrc: Rectal Rectal Rectal   SpO2: 99% 100% 98%   Weight:      Height:        Gen:  NAD    The results of significant diagnostics from this hospitalization (including imaging, microbiology, ancillary and laboratory) are listed below for reference.     Procedures and Diagnostic Studies:   Dg Chest 2 View  Result Date: 08/23/2015 CLINICAL DATA:  Sepsis. EXAM: CHEST  2 VIEW COMPARISON:  04/18/2009 FINDINGS: Two views of the chest demonstrate slightly low lung volumes. No focal airspace disease or pulmonary edema. Heart and mediastinum are within normal limits.  Mild degenerative changes in the thoracic spine. No large pleural effusions. IMPRESSION: Low lung volumes without focal disease. Electronically Signed   By: Markus Daft M.D.   On: 08/23/2015 20:05   Ct Head Wo Contrast  Result Date: 08/24/2015 CLINICAL DATA:  70 y/o F; decreased appetite and 2 days of increasingly less activity with history of dementia. Oral EXAM: CT HEAD WITHOUT CONTRAST TECHNIQUE: Contiguous axial images were obtained from the base of the skull through the vertex without intravenous contrast. COMPARISON:  CT of head dated 01/25/2015.  FINDINGS: Brain: No evidence of acute infarction, hemorrhage, hydrocephalus, extra-axial collection or mass lesion/mass effect. Stable mild parenchymal volume loss and chronic microvascular ischemic changes. Vascular: No hyperdense vessel or unexpected calcification. Skull: No displaced skull fracture. Sinuses/Orbits: No acute finding. Other: No unexpected finding. IMPRESSION: No acute intracranial abnormality is identified. Stable mild parenchymal volume loss and chronic microvascular ischemic changes. Electronically Signed   By: Kristine Garbe M.D.   On: 08/24/2015 00:00     Labs:   Basic Metabolic Panel:  Recent Labs Lab 08/23/15 1956 08/24/15 0334 08/25/15 0807 08/26/15 0607 08/27/15 0509  NA 132* 137 140 139 138  K 3.6 3.2* 3.0* 3.0* 3.3*  CL 96* 104 109 108 105  CO2 26 23 26 24 26   GLUCOSE 154* 166* 129* 125* 128*  BUN 13 9 7 8 7   CREATININE 0.98 0.77 0.72 0.66 0.65  CALCIUM 9.1 8.2* 8.0* 8.4* 8.6*   GFR Estimated Creatinine Clearance: 63.6 mL/min (by C-G formula based on SCr of 0.8 mg/dL). Liver Function Tests:  Recent Labs Lab 08/23/15 1956  AST 14*  ALT 11*  ALKPHOS 65  BILITOT 1.7*  PROT 8.2*  ALBUMIN 4.2   No results for input(s): LIPASE, AMYLASE in the last 168 hours. No results for input(s): AMMONIA in the last 168 hours. Coagulation profile  Recent Labs Lab 08/24/15 0048  INR 1.17    CBC:  Recent Labs Lab 08/23/15 1956 08/24/15 0334 08/25/15 0807 08/26/15 0607 08/27/15 0509  WBC 11.7* 9.1 4.3 3.1* 3.7*  NEUTROABS 9.9*  --   --   --   --   HGB 14.1 14.1 12.3 12.5 12.8  HCT 42.0 42.0 36.0 36.9 38.4  MCV 90.7 89.9 88.2 88.3 87.9  PLT 198 148* 141* 180 233   Cardiac Enzymes: No results for input(s): CKTOTAL, CKMB, CKMBINDEX, TROPONINI in the last 168 hours. BNP: Invalid input(s): POCBNP CBG:  Recent Labs Lab 08/27/15 1200 08/27/15 1730 08/27/15 2259 08/28/15 0731 08/28/15 1207  GLUCAP 184* 184* 134* 169* 309*    D-Dimer No results for input(s): DDIMER in the last 72 hours. Hgb A1c No results for input(s): HGBA1C in the last 72 hours. Lipid Profile No results for input(s): CHOL, HDL, LDLCALC, TRIG, CHOLHDL, LDLDIRECT in the last 72 hours. Thyroid function studies No results for input(s): TSH, T4TOTAL, T3FREE, THYROIDAB in the last 72 hours.  Invalid input(s): FREET3 Anemia work up No results for input(s): VITAMINB12, FOLATE, FERRITIN, TIBC, IRON, RETICCTPCT in the last 72 hours. Microbiology Recent Results (from the past 240 hour(s))  Urine culture     Status: Abnormal   Collection Time: 08/23/15  7:20 PM  Result Value Ref Range Status   Specimen Description URINE, CLEAN CATCH  Final   Special Requests NONE  Final   Culture 70,000 COLONIES/mL ESCHERICHIA COLI (A)  Final   Report Status 08/26/2015 FINAL  Final   Organism ID, Bacteria ESCHERICHIA COLI (A)  Final      Susceptibility  Escherichia coli - MIC*    AMPICILLIN 4 SENSITIVE Sensitive     CEFAZOLIN <=4 SENSITIVE Sensitive     CEFTRIAXONE <=1 SENSITIVE Sensitive     CIPROFLOXACIN <=0.25 SENSITIVE Sensitive     GENTAMICIN <=1 SENSITIVE Sensitive     IMIPENEM <=0.25 SENSITIVE Sensitive     NITROFURANTOIN <=16 SENSITIVE Sensitive     TRIMETH/SULFA <=20 SENSITIVE Sensitive     AMPICILLIN/SULBACTAM <=2 SENSITIVE Sensitive     PIP/TAZO <=4 SENSITIVE Sensitive     Extended ESBL NEGATIVE Sensitive     * 70,000 COLONIES/mL ESCHERICHIA COLI  Blood Culture (routine x 2)     Status: Abnormal   Collection Time: 08/23/15  7:56 PM  Result Value Ref Range Status   Specimen Description BLOOD RIGHT ANTECUBITAL  Final   Special Requests BOTTLES DRAWN AEROBIC AND ANAEROBIC 5CC  Final   Culture  Setup Time   Final    GRAM NEGATIVE RODS AEROBIC BOTTLE ONLY CRITICAL VALUE NOTED.  VALUE IS CONSISTENT WITH PREVIOUSLY REPORTED AND CALLED VALUE.    Culture (A)  Final    ESCHERICHIA COLI SUSCEPTIBILITIES PERFORMED ON PREVIOUS CULTURE WITHIN THE  LAST 5 DAYS. Performed at [facility]    Report Status 08/27/2015 FINAL  Final  Blood Culture (routine x 2)     Status: Abnormal   Collection Time: 08/23/15  8:10 PM  Result Value Ref Range Status   Specimen Description BLOOD BLOOD LEFT FOREARM  Final   Special Requests BOTTLES DRAWN AEROBIC AND ANAEROBIC 5CC  Final   Culture  Setup Time   Final    GRAM NEGATIVE RODS AEROBIC BOTTLE ONLY CRITICAL RESULT CALLED TO, READ BACK BY AND VERIFIED WITH: B GREEN,PHARMD AT 1100 08/25/15 BY L BENFIELD Performed at [place] (A)  Final   Report Status 08/27/2015 FINAL  Final   Organism ID, Bacteria ESCHERICHIA COLI  Final      Susceptibility   Escherichia coli - MIC*    AMPICILLIN <=2 SENSITIVE Sensitive     CEFAZOLIN <=4 SENSITIVE Sensitive     CEFEPIME <=1 SENSITIVE Sensitive     CEFTAZIDIME <=1 SENSITIVE Sensitive     CEFTRIAXONE <=1 SENSITIVE Sensitive     CIPROFLOXACIN <=0.25 SENSITIVE Sensitive     GENTAMICIN <=1 SENSITIVE Sensitive     IMIPENEM <=0.25 SENSITIVE Sensitive     TRIMETH/SULFA <=20 SENSITIVE Sensitive     AMPICILLIN/SULBACTAM <=2 SENSITIVE Sensitive     PIP/TAZO <=4 SENSITIVE Sensitive     Extended ESBL NEGATIVE Sensitive     * ESCHERICHIA COLI  Blood Culture ID Panel (Reflexed)     Status: Abnormal   Collection Time: 08/23/15  8:10 PM  Result Value Ref Range Status   Enterococcus species NOT DETECTED NOT DETECTED Final   Vancomycin resistance NOT DETECTED NOT DETECTED Final   Listeria monocytogenes NOT DETECTED NOT DETECTED Final   Staphylococcus species NOT DETECTED NOT DETECTED Final   Staphylococcus aureus NOT DETECTED NOT DETECTED Final   Methicillin resistance NOT DETECTED NOT DETECTED Final   Streptococcus species NOT DETECTED NOT DETECTED Final   Streptococcus agalactiae NOT DETECTED NOT DETECTED Final   Streptococcus pneumoniae NOT DETECTED NOT DETECTED Final   Streptococcus pyogenes NOT DETECTED NOT DETECTED  Final   Acinetobacter baumannii NOT DETECTED NOT DETECTED Final   Enterobacteriaceae species DETECTED (A) NOT DETECTED Final    Comment: CRITICAL RESULT CALLED TO, READ BACK BY AND VERIFIED WITH: B GREEN,PHARMD AT 1100 08/25/15 BY L  BENFIELD    Enterobacter cloacae complex NOT DETECTED NOT DETECTED Final   Escherichia coli DETECTED (A) NOT DETECTED Final    Comment: CRITICAL RESULT CALLED TO, READ BACK BY AND VERIFIED WITH: B GREEN,PHARMD AT 1100 08/25/15 BY L BENFIELD    Klebsiella oxytoca NOT DETECTED NOT DETECTED Final   Klebsiella pneumoniae NOT DETECTED NOT DETECTED Final   Proteus species NOT DETECTED NOT DETECTED Final   Serratia marcescens NOT DETECTED NOT DETECTED Final   Carbapenem resistance NOT DETECTED NOT DETECTED Final   Haemophilus influenzae NOT DETECTED NOT DETECTED Final   Neisseria meningitidis NOT DETECTED NOT DETECTED Final   Pseudomonas aeruginosa NOT DETECTED NOT DETECTED Final   Candida albicans NOT DETECTED NOT DETECTED Final   Candida glabrata NOT DETECTED NOT DETECTED Final   Candida krusei NOT DETECTED NOT DETECTED Final   Candida parapsilosis NOT DETECTED NOT DETECTED Final   Candida tropicalis NOT DETECTED NOT DETECTED Final    Comment: Performed at [facility]  Culture, blood (Routine X 2) w Reflex to ID Panel     Status: None (Preliminary result)   Collection Time: 08/26/15  6:07 AM  Result Value Ref Range Status   Specimen Description BLOOD LEFT FOREARM  Final   Special Requests BOTTLES DRAWN AEROBIC AND ANAEROBIC 7 CC  Final   Culture   Final    NO GROWTH 1 DAY Performed at [facility]    Report Status PENDING  Incomplete  Culture, blood (Routine X 2) w Reflex to ID Panel     Status: None (Preliminary result)   Collection Time: 08/26/15  6:07 AM  Result Value Ref Range Status   Specimen Description BLOOD LEFT ANTECUBITAL  Final   Special Requests BOTTLES DRAWN AEROBIC AND ANAEROBIC 10CC  Final   Culture   Final    NO GROWTH 1  DAY Performed at [facility]    Report Status PENDING  Incomplete     Discharge Instructions:   Discharge Instructions    Diet Carb Modified    Complete by:  As directed   Discharge instructions    Complete by:  As directed   24 hour supervision Can use PRN imodium for watery stools Return to ER for fever, N/V   Increase activity slowly    Complete by:  As directed       Medication List    TAKE these medications   acetaminophen 325 MG tablet Commonly known as:  TYLENOL Take 2 tablets (650 mg total) by mouth every 4 (four) hours as needed for mild pain (or Fever >/= 101).   amLODipine 10 MG tablet Commonly known as:  NORVASC Take 10 mg by mouth daily.   aspirin EC 81 MG tablet Take 81 mg by mouth at bedtime.   atorvastatin 20 MG tablet Commonly known as:  LIPITOR Take 20 mg by mouth daily.   cholecalciferol 400 units Tabs tablet Commonly known as:  VITAMIN D Take 400 Units by mouth daily.   ciprofloxacin 500 MG tablet Commonly known as:  CIPRO Take 1 tablet (500 mg total) by mouth 2 (two) times daily.   co-enzyme Q-10 30 MG capsule Take 30 mg by mouth daily.   donepezil 23 MG Tabs tablet Commonly known as:  ARICEPT Take 23 mg by mouth at bedtime.   escitalopram 10 MG tablet Commonly known as:  LEXAPRO TAKE 1 TABLET DAILY What changed:  See the new instructions.   fosinopril 40 MG tablet Commonly known as:  MONOPRIL  Take 40 mg by mouth daily.   levothyroxine 75 MCG tablet Commonly known as:  SYNTHROID, LEVOTHROID Take 75 mcg by mouth daily.   metFORMIN 1000 MG tablet Commonly known as:  GLUCOPHAGE Take 1,000 mg by mouth daily with breakfast.   metroNIDAZOLE 500 MG tablet Commonly known as:  FLAGYL Take 1 tablet (500 mg total) by mouth every 8 (eight) hours.   NAMENDA XR 28 MG Cp24 24 hr capsule Generic drug:  memantine Take 1 capsule by mouth daily.   saccharomyces boulardii 250 MG capsule Commonly known as:  FLORASTOR Take 1  capsule (250 mg total) by mouth 2 (two) times daily.   vitamin E 1000 UNIT capsule Generic drug:  vitamin E Take 2,000 Units by mouth daily.         Time coordinating discharge: 25 min  Signed:  Dayveon Halley U Tanaisha Pittman   [facility] 08/28/2015, 1:40 PM

## 2015-08-28 NOTE — Plan of Care (Signed)
Problem: Safety: Goal: Ability to remain free from injury will improve Outcome: Completed/Met Date Met: 08/28/15 Pt has safety mittens, bed alarm set. Family is at bedside. Safety prevention measures and goals discussed

## 2015-08-31 LAB — CULTURE, BLOOD (ROUTINE X 2)
CULTURE: NO GROWTH
Culture: NO GROWTH

## 2015-09-04 DIAGNOSIS — I10 Essential (primary) hypertension: Secondary | ICD-10-CM | POA: Diagnosis not present

## 2015-09-04 DIAGNOSIS — N3000 Acute cystitis without hematuria: Secondary | ICD-10-CM | POA: Diagnosis not present

## 2015-09-04 DIAGNOSIS — G309 Alzheimer's disease, unspecified: Secondary | ICD-10-CM | POA: Diagnosis not present

## 2015-10-01 DIAGNOSIS — E1142 Type 2 diabetes mellitus with diabetic polyneuropathy: Secondary | ICD-10-CM | POA: Diagnosis not present

## 2015-10-01 DIAGNOSIS — Z0000 Encounter for general adult medical examination without abnormal findings: Secondary | ICD-10-CM | POA: Diagnosis not present

## 2015-10-01 DIAGNOSIS — G309 Alzheimer's disease, unspecified: Secondary | ICD-10-CM | POA: Diagnosis not present

## 2015-10-01 DIAGNOSIS — F325 Major depressive disorder, single episode, in full remission: Secondary | ICD-10-CM | POA: Diagnosis not present

## 2015-10-01 DIAGNOSIS — E7800 Pure hypercholesterolemia, unspecified: Secondary | ICD-10-CM | POA: Diagnosis not present

## 2015-10-01 DIAGNOSIS — E039 Hypothyroidism, unspecified: Secondary | ICD-10-CM | POA: Diagnosis not present

## 2015-10-01 DIAGNOSIS — I10 Essential (primary) hypertension: Secondary | ICD-10-CM | POA: Diagnosis not present

## 2015-10-01 DIAGNOSIS — Z7984 Long term (current) use of oral hypoglycemic drugs: Secondary | ICD-10-CM | POA: Diagnosis not present

## 2015-10-01 DIAGNOSIS — Z23 Encounter for immunization: Secondary | ICD-10-CM | POA: Diagnosis not present

## 2015-10-01 DIAGNOSIS — Z79899 Other long term (current) drug therapy: Secondary | ICD-10-CM | POA: Diagnosis not present

## 2015-10-17 ENCOUNTER — Other Ambulatory Visit: Payer: Self-pay | Admitting: Neurology

## 2015-11-01 DIAGNOSIS — Z85828 Personal history of other malignant neoplasm of skin: Secondary | ICD-10-CM | POA: Diagnosis not present

## 2015-11-01 DIAGNOSIS — L905 Scar conditions and fibrosis of skin: Secondary | ICD-10-CM | POA: Diagnosis not present

## 2015-11-01 DIAGNOSIS — L853 Xerosis cutis: Secondary | ICD-10-CM | POA: Diagnosis not present

## 2015-11-02 ENCOUNTER — Ambulatory Visit: Payer: Medicare Other | Admitting: Podiatry

## 2015-11-20 ENCOUNTER — Ambulatory Visit (INDEPENDENT_AMBULATORY_CARE_PROVIDER_SITE_OTHER): Payer: Medicare Other | Admitting: Podiatry

## 2015-11-20 ENCOUNTER — Encounter: Payer: Self-pay | Admitting: Podiatry

## 2015-11-20 VITALS — BP 106/60 | HR 74

## 2015-11-20 DIAGNOSIS — B351 Tinea unguium: Secondary | ICD-10-CM

## 2015-11-20 DIAGNOSIS — M79676 Pain in unspecified toe(s): Secondary | ICD-10-CM

## 2015-11-20 DIAGNOSIS — M79674 Pain in right toe(s): Secondary | ICD-10-CM

## 2015-11-20 DIAGNOSIS — E119 Type 2 diabetes mellitus without complications: Secondary | ICD-10-CM

## 2015-11-20 DIAGNOSIS — L603 Nail dystrophy: Secondary | ICD-10-CM

## 2015-11-20 DIAGNOSIS — M79675 Pain in left toe(s): Secondary | ICD-10-CM

## 2015-12-02 NOTE — Progress Notes (Signed)
SUBJECTIVE Patient with a history of diabetes mellitus presents to office today complaining of elongated, thickened nails. Pain while ambulating in shoes. Patient is unable to trim their own nails.   Allergies  Allergen Reactions  . Shellfish Allergy Anaphylaxis  . Demerol [Meperidine] Nausea And Vomiting    OBJECTIVE General Patient is awake, alert, and oriented x 3 and in no acute distress. Derm Skin is dry and supple bilateral. Negative open lesions or macerations. Remaining integument unremarkable. Nails are tender, long, thickened and dystrophic with subungual debris, consistent with onychomycosis, 1-5 bilateral. No signs of infection noted. Vasc  DP and PT pedal pulses palpable bilaterally. Temperature gradient within normal limits.  Neuro Epicritic and protective threshold sensation diminished bilaterally.  Musculoskeletal Exam No symptomatic pedal deformities noted bilateral. Muscular strength within normal limits.  ASSESSMENT 1. Diabetes Mellitus w/ peripheral neuropathy 2. Onychomycosis of nail due to dermatophyte bilateral 3. Pain in foot bilateral  PLAN OF CARE 1. Patient evaluated today. 2. Instructed to maintain good pedal hygiene and foot care. Stressed importance of controlling blood sugar.  3. Mechanical debridement of nails 1-5 bilaterally performed using a nail nipper. Filed with dremel without incident.  4. Return to clinic in 3 mos.    Edrick Kins, DPM

## 2016-01-17 DIAGNOSIS — Z803 Family history of malignant neoplasm of breast: Secondary | ICD-10-CM | POA: Diagnosis not present

## 2016-01-17 DIAGNOSIS — Z1231 Encounter for screening mammogram for malignant neoplasm of breast: Secondary | ICD-10-CM | POA: Diagnosis not present

## 2016-02-25 ENCOUNTER — Ambulatory Visit: Payer: Medicare Other | Admitting: Podiatry

## 2016-02-26 ENCOUNTER — Ambulatory Visit (INDEPENDENT_AMBULATORY_CARE_PROVIDER_SITE_OTHER): Payer: Medicare Other | Admitting: Podiatry

## 2016-02-26 ENCOUNTER — Encounter: Payer: Self-pay | Admitting: Podiatry

## 2016-02-26 DIAGNOSIS — B351 Tinea unguium: Secondary | ICD-10-CM | POA: Diagnosis not present

## 2016-02-26 DIAGNOSIS — M79609 Pain in unspecified limb: Secondary | ICD-10-CM | POA: Diagnosis not present

## 2016-02-26 DIAGNOSIS — L603 Nail dystrophy: Secondary | ICD-10-CM

## 2016-02-26 DIAGNOSIS — L608 Other nail disorders: Secondary | ICD-10-CM | POA: Diagnosis not present

## 2016-02-26 DIAGNOSIS — E0843 Diabetes mellitus due to underlying condition with diabetic autonomic (poly)neuropathy: Secondary | ICD-10-CM | POA: Diagnosis not present

## 2016-02-26 NOTE — Progress Notes (Signed)
   SUBJECTIVE Patient with a history of diabetes mellitus presents to office today complaining of elongated, thickened nails. Pain while ambulating in shoes. Patient is unable to trim their own nails.   OBJECTIVE General Patient is awake, alert, and oriented x 3 and in no acute distress. Derm Skin is dry and supple bilateral. Negative open lesions or macerations. Remaining integument unremarkable. Nails are tender, long, thickened and dystrophic with subungual debris, consistent with onychomycosis, 1-5 bilateral. No signs of infection noted. Vasc  DP and PT pedal pulses palpable bilaterally. Temperature gradient within normal limits.  Neuro Epicritic and protective threshold sensation diminished bilaterally.  Musculoskeletal Exam No symptomatic pedal deformities noted bilateral. Muscular strength within normal limits.  ASSESSMENT 1. Diabetes Mellitus w/ peripheral neuropathy 2. Onychomycosis of nail due to dermatophyte bilateral 3. Pain in foot bilateral  PLAN OF CARE 1. Patient evaluated today. 2. Instructed to maintain good pedal hygiene and foot care. Stressed importance of controlling blood sugar.  3. Mechanical debridement of nails 1-5 bilaterally performed using a nail nipper. Filed with dremel without incident.  4. Return to clinic in 3 mos.     Ahmad Vanwey M. Artina Minella, DPM [facility]  Dr. Haly Feher M. Ladarrell Cornwall, DPM    [address]                                        , [place] [address]                Office [phone]  Fax [phone]       

## 2016-04-03 DIAGNOSIS — E119 Type 2 diabetes mellitus without complications: Secondary | ICD-10-CM | POA: Diagnosis not present

## 2016-04-03 DIAGNOSIS — E7800 Pure hypercholesterolemia, unspecified: Secondary | ICD-10-CM | POA: Diagnosis not present

## 2016-04-03 DIAGNOSIS — G309 Alzheimer's disease, unspecified: Secondary | ICD-10-CM | POA: Diagnosis not present

## 2016-04-03 DIAGNOSIS — Z79899 Other long term (current) drug therapy: Secondary | ICD-10-CM | POA: Diagnosis not present

## 2016-04-03 DIAGNOSIS — Z7984 Long term (current) use of oral hypoglycemic drugs: Secondary | ICD-10-CM | POA: Diagnosis not present

## 2016-04-03 DIAGNOSIS — I10 Essential (primary) hypertension: Secondary | ICD-10-CM | POA: Diagnosis not present

## 2016-04-17 DIAGNOSIS — H25813 Combined forms of age-related cataract, bilateral: Secondary | ICD-10-CM | POA: Diagnosis not present

## 2016-04-17 DIAGNOSIS — I10 Essential (primary) hypertension: Secondary | ICD-10-CM | POA: Diagnosis not present

## 2016-04-17 DIAGNOSIS — E119 Type 2 diabetes mellitus without complications: Secondary | ICD-10-CM | POA: Diagnosis not present

## 2016-04-17 DIAGNOSIS — H35033 Hypertensive retinopathy, bilateral: Secondary | ICD-10-CM | POA: Diagnosis not present

## 2016-05-27 ENCOUNTER — Encounter: Payer: Self-pay | Admitting: Podiatry

## 2016-05-27 ENCOUNTER — Ambulatory Visit (INDEPENDENT_AMBULATORY_CARE_PROVIDER_SITE_OTHER): Payer: Medicare Other | Admitting: Podiatry

## 2016-05-27 DIAGNOSIS — E0842 Diabetes mellitus due to underlying condition with diabetic polyneuropathy: Secondary | ICD-10-CM | POA: Diagnosis not present

## 2016-05-27 DIAGNOSIS — M79676 Pain in unspecified toe(s): Secondary | ICD-10-CM | POA: Diagnosis not present

## 2016-05-27 DIAGNOSIS — B351 Tinea unguium: Secondary | ICD-10-CM

## 2016-05-27 NOTE — Progress Notes (Signed)
   SUBJECTIVE Patient with a history of diabetes mellitus presents to office today complaining of elongated, thickened nails. Pain while ambulating in shoes. Patient is unable to trim their own nails.   OBJECTIVE General Patient is awake, alert, and oriented x 3 and in no acute distress. Derm Skin is dry and supple bilateral. Negative open lesions or macerations. Remaining integument unremarkable. Nails are tender, long, thickened and dystrophic with subungual debris, consistent with onychomycosis, 1-5 bilateral. No signs of infection noted. Vasc  DP and PT pedal pulses palpable bilaterally. Temperature gradient within normal limits.  Neuro Epicritic and protective threshold sensation diminished bilaterally.  Musculoskeletal Exam No symptomatic pedal deformities noted bilateral. Muscular strength within normal limits.  ASSESSMENT 1. Diabetes Mellitus w/ peripheral neuropathy 2. Onychomycosis of nail due to dermatophyte bilateral 3. Pain in foot bilateral  PLAN OF CARE 1. Patient evaluated today. 2. Instructed to maintain good pedal hygiene and foot care. Stressed importance of controlling blood sugar.  3. Mechanical debridement of nails 1-5 bilaterally performed using a nail nipper. Filed with dremel without incident.  4. Return to clinic in 3 mos.     Brent M. Evans, DPM [facility]  Dr. Brent M. Evans, DPM    [address]                                        [place], [place] [address]                Office [phone]  Fax [phone]       

## 2016-09-02 ENCOUNTER — Ambulatory Visit (INDEPENDENT_AMBULATORY_CARE_PROVIDER_SITE_OTHER): Payer: Medicare Other | Admitting: Podiatry

## 2016-09-02 DIAGNOSIS — M79676 Pain in unspecified toe(s): Secondary | ICD-10-CM | POA: Diagnosis not present

## 2016-09-02 DIAGNOSIS — E0842 Diabetes mellitus due to underlying condition with diabetic polyneuropathy: Secondary | ICD-10-CM

## 2016-09-02 DIAGNOSIS — B351 Tinea unguium: Secondary | ICD-10-CM

## 2016-09-08 NOTE — Progress Notes (Signed)
   SUBJECTIVE Patient with a history of diabetes mellitus presents to office today complaining of elongated, thickened nails. Pain while ambulating in shoes. Patient is unable to trim their own nails.   OBJECTIVE General Patient is awake, alert, and oriented x 3 and in no acute distress. Derm Skin is dry and supple bilateral. Negative open lesions or macerations. Remaining integument unremarkable. Nails are tender, long, thickened and dystrophic with subungual debris, consistent with onychomycosis, 1-5 bilateral. No signs of infection noted. Vasc  DP and PT pedal pulses palpable bilaterally. Temperature gradient within normal limits.  Neuro Epicritic and protective threshold sensation diminished bilaterally.  Musculoskeletal Exam No symptomatic pedal deformities noted bilateral. Muscular strength within normal limits.  ASSESSMENT 1. Diabetes Mellitus w/ peripheral neuropathy 2. Onychomycosis of nail due to dermatophyte bilateral 3. Pain in foot bilateral  PLAN OF CARE 1. Patient evaluated today. 2. Instructed to maintain good pedal hygiene and foot care. Stressed importance of controlling blood sugar.  3. Mechanical debridement of nails 1-5 bilaterally performed using a nail nipper. Filed with dremel without incident.  4. Return to clinic in 3 mos.     Catherine Oak M. Timofey Carandang, DPM [facility]  Dr. Thy Gullikson M. Steve Youngberg, DPM    [address]                                        [place], [place] [address]                Office [phone]  Fax [phone]       

## 2016-10-02 DIAGNOSIS — Z0000 Encounter for general adult medical examination without abnormal findings: Secondary | ICD-10-CM | POA: Diagnosis not present

## 2016-10-02 DIAGNOSIS — G309 Alzheimer's disease, unspecified: Secondary | ICD-10-CM | POA: Diagnosis not present

## 2016-10-02 DIAGNOSIS — Z79899 Other long term (current) drug therapy: Secondary | ICD-10-CM | POA: Diagnosis not present

## 2016-10-02 DIAGNOSIS — E039 Hypothyroidism, unspecified: Secondary | ICD-10-CM | POA: Diagnosis not present

## 2016-10-02 DIAGNOSIS — I10 Essential (primary) hypertension: Secondary | ICD-10-CM | POA: Diagnosis not present

## 2016-10-02 DIAGNOSIS — E7800 Pure hypercholesterolemia, unspecified: Secondary | ICD-10-CM | POA: Diagnosis not present

## 2016-10-02 DIAGNOSIS — E119 Type 2 diabetes mellitus without complications: Secondary | ICD-10-CM | POA: Diagnosis not present

## 2016-10-02 DIAGNOSIS — F325 Major depressive disorder, single episode, in full remission: Secondary | ICD-10-CM | POA: Diagnosis not present

## 2016-10-02 DIAGNOSIS — Z23 Encounter for immunization: Secondary | ICD-10-CM | POA: Diagnosis not present

## 2016-10-02 DIAGNOSIS — E1142 Type 2 diabetes mellitus with diabetic polyneuropathy: Secondary | ICD-10-CM | POA: Diagnosis not present

## 2016-10-02 DIAGNOSIS — Z7984 Long term (current) use of oral hypoglycemic drugs: Secondary | ICD-10-CM | POA: Diagnosis not present

## 2016-11-13 DIAGNOSIS — L821 Other seborrheic keratosis: Secondary | ICD-10-CM | POA: Diagnosis not present

## 2016-11-13 DIAGNOSIS — L570 Actinic keratosis: Secondary | ICD-10-CM | POA: Diagnosis not present

## 2016-11-13 DIAGNOSIS — L905 Scar conditions and fibrosis of skin: Secondary | ICD-10-CM | POA: Diagnosis not present

## 2016-12-05 ENCOUNTER — Ambulatory Visit: Payer: Medicare Other | Admitting: Podiatry

## 2016-12-08 DIAGNOSIS — I10 Essential (primary) hypertension: Secondary | ICD-10-CM | POA: Diagnosis not present

## 2016-12-08 DIAGNOSIS — G309 Alzheimer's disease, unspecified: Secondary | ICD-10-CM | POA: Diagnosis not present

## 2016-12-08 DIAGNOSIS — Z7984 Long term (current) use of oral hypoglycemic drugs: Secondary | ICD-10-CM | POA: Diagnosis not present

## 2016-12-08 DIAGNOSIS — E119 Type 2 diabetes mellitus without complications: Secondary | ICD-10-CM | POA: Diagnosis not present

## 2016-12-12 ENCOUNTER — Encounter: Payer: Self-pay | Admitting: Podiatry

## 2016-12-12 ENCOUNTER — Ambulatory Visit (INDEPENDENT_AMBULATORY_CARE_PROVIDER_SITE_OTHER): Payer: Medicare Other | Admitting: Podiatry

## 2016-12-12 DIAGNOSIS — B351 Tinea unguium: Secondary | ICD-10-CM

## 2016-12-12 DIAGNOSIS — M79676 Pain in unspecified toe(s): Secondary | ICD-10-CM | POA: Diagnosis not present

## 2016-12-12 DIAGNOSIS — E0842 Diabetes mellitus due to underlying condition with diabetic polyneuropathy: Secondary | ICD-10-CM

## 2016-12-15 NOTE — Progress Notes (Signed)
   SUBJECTIVE Patient with a history of diabetes mellitus presents to office today complaining of elongated, thickened nails. Pain while ambulating in shoes. Patient is unable to trim their own nails.   Past Medical History:  Diagnosis Date  . Cystocele   . Dementia   . Depressed   . Diabetes mellitus   . Endometriosis   . Hypertension   . Rectocele   . Thyroid disease   . Urinary incontinence    USI    OBJECTIVE General Patient is awake, alert, and oriented x 3 and in no acute distress. Derm Skin is dry and supple bilateral. Negative open lesions or macerations. Remaining integument unremarkable. Nails are tender, long, thickened and dystrophic with subungual debris, consistent with onychomycosis, 1-5 bilateral. No signs of infection noted. Vasc  DP and PT pedal pulses palpable bilaterally. Temperature gradient within normal limits.  Neuro Epicritic and protective threshold sensation diminished bilaterally.  Musculoskeletal Exam No symptomatic pedal deformities noted bilateral. Muscular strength within normal limits.  ASSESSMENT 1. Diabetes Mellitus w/ peripheral neuropathy 2. Onychomycosis of nail due to dermatophyte bilateral 3. Pain in foot bilateral  PLAN OF CARE 1. Patient evaluated today. 2. Instructed to maintain good pedal hygiene and foot care. Stressed importance of controlling blood sugar.  3. Mechanical debridement of nails 1-5 bilaterally performed using a nail nipper. Filed with dremel without incident.  4. Return to clinic in 3 mos.     Edrick Kins, DPM [facility]  Dr. Edrick Kins, [address]                                        [place], [place] [address]                Office [phone]  Fax [phone]

## 2016-12-29 IMAGING — US US RENAL
1 series · 14 of 25 positions shown · non-contrast
Comparison: None.

CLINICAL DATA: Urinary incontinence and sepsis.  Fever

EXAM:
RENAL / URINARY TRACT ULTRASOUND COMPLETE

[Series 1: us renal · 0.22mm/px · 14 of 46 slices shown]
[im 1/46]
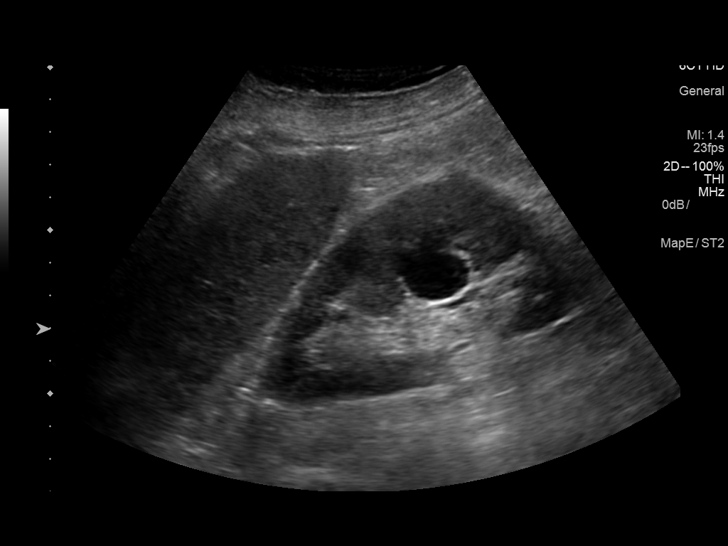
[im 4/46]
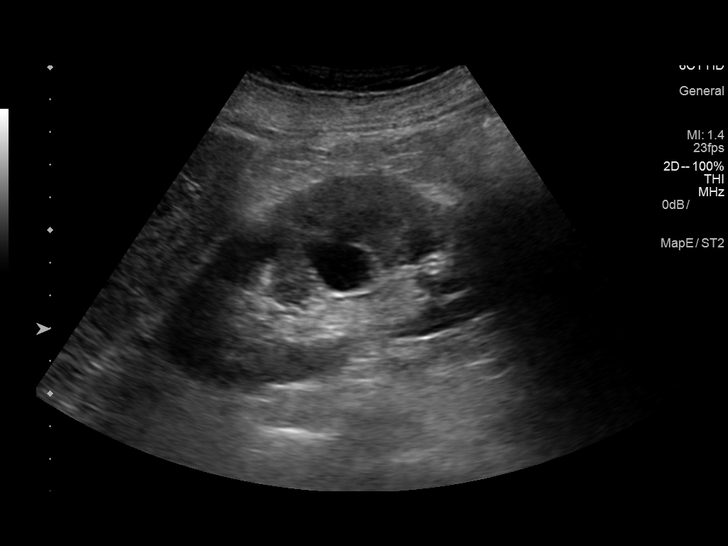
[im 8/46]
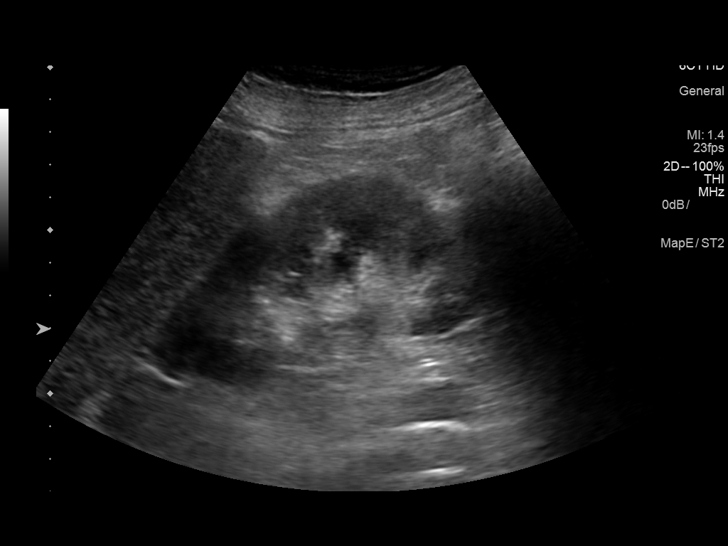
[im 12/46]
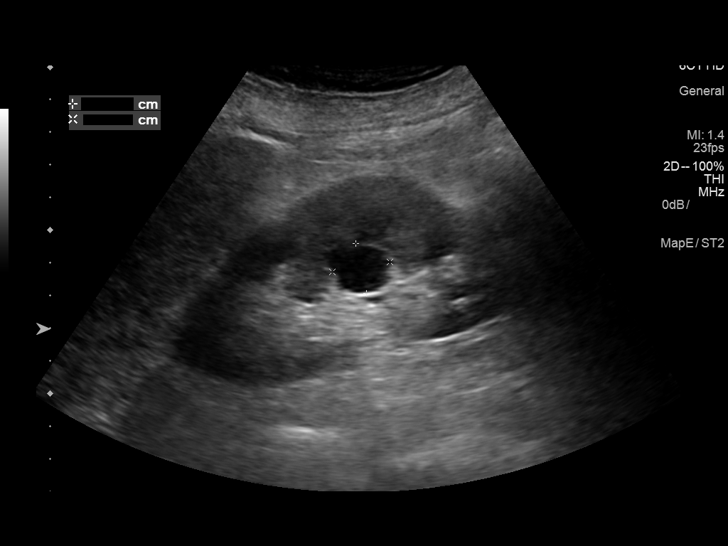
[im 16/46]
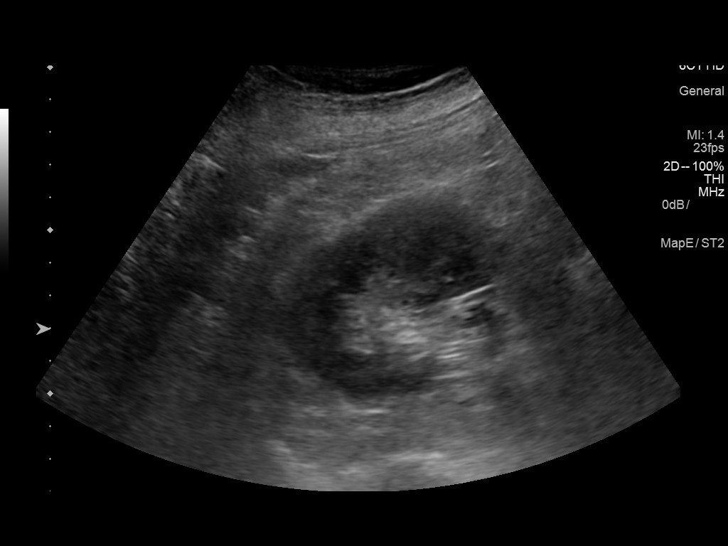
[im 17/46]
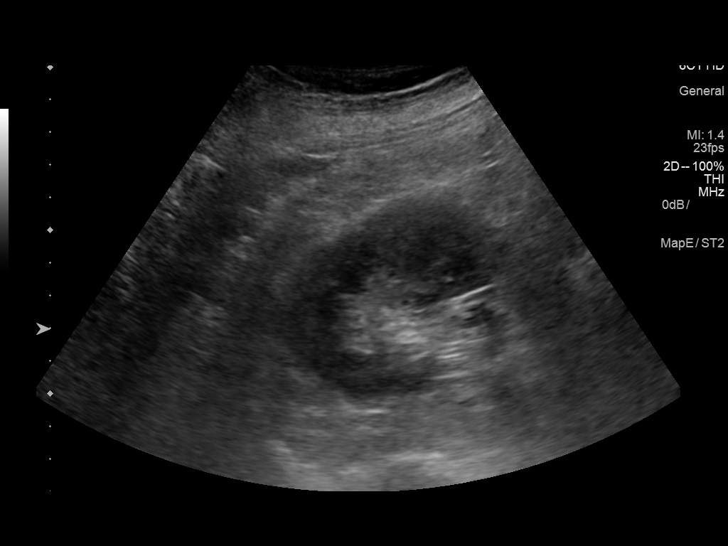
[im 21/46]
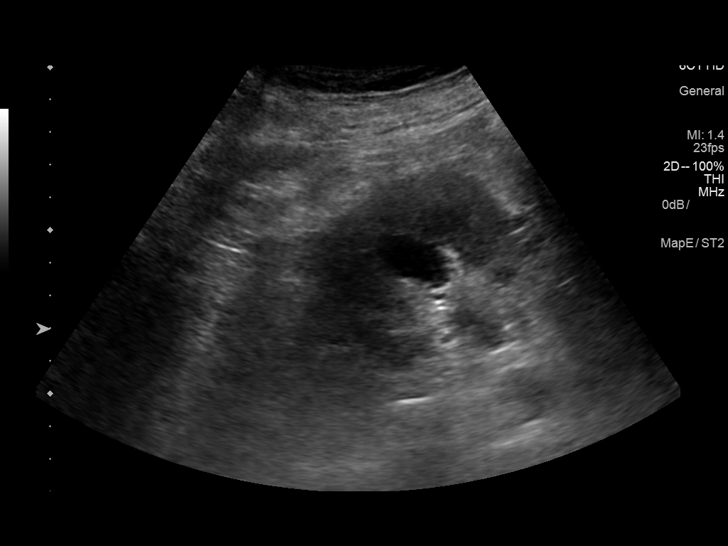
[im 25/46]
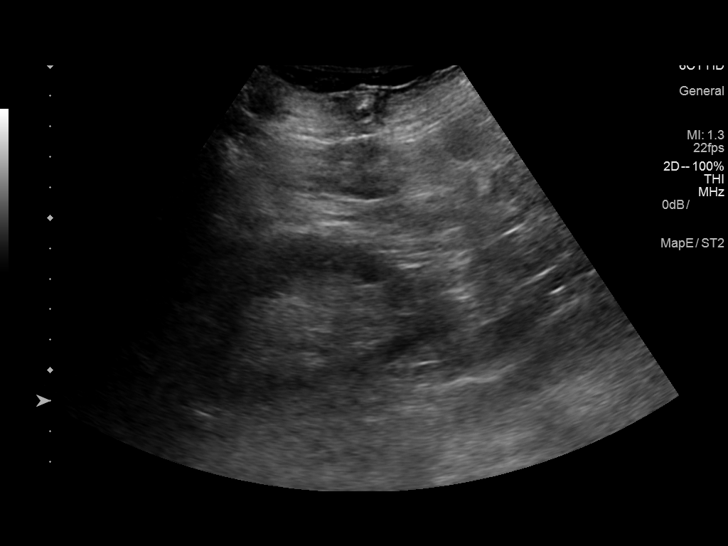
[im 29/46]
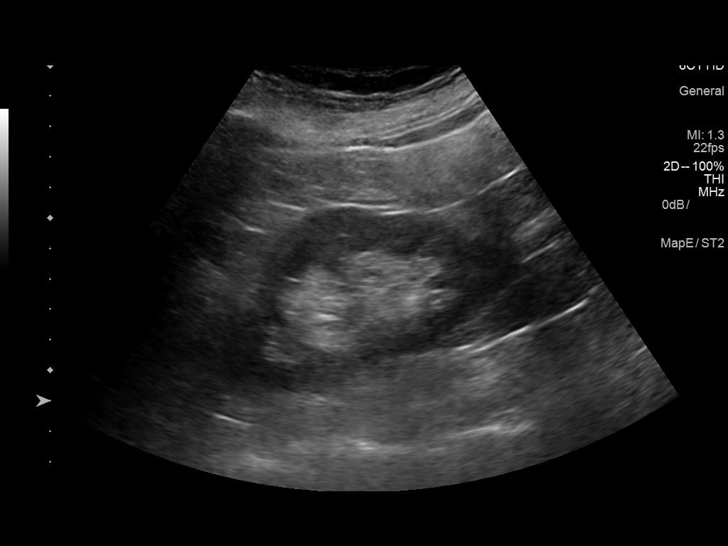
[im 31/46]
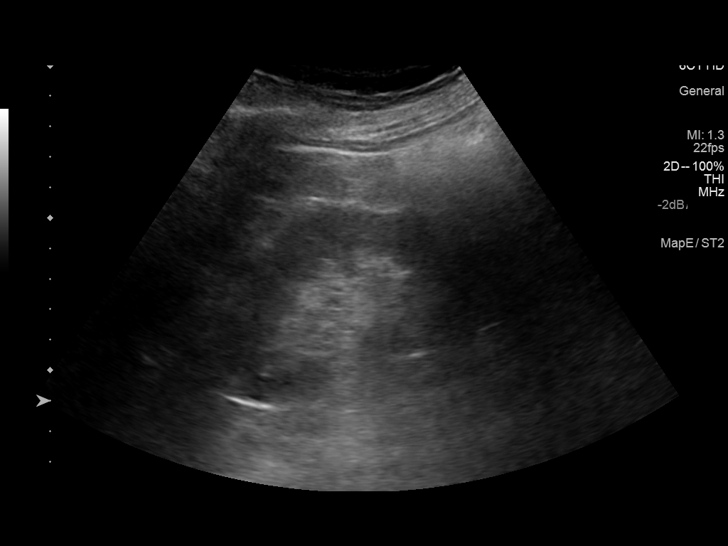
[im 34/46]
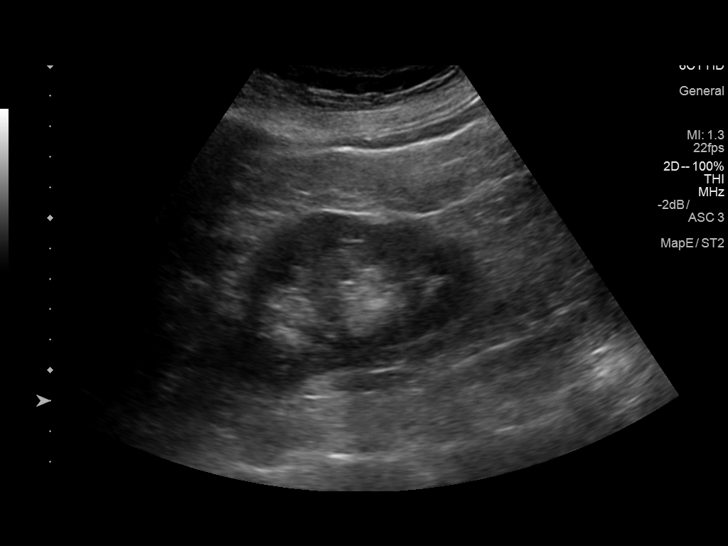
[im 38/46]
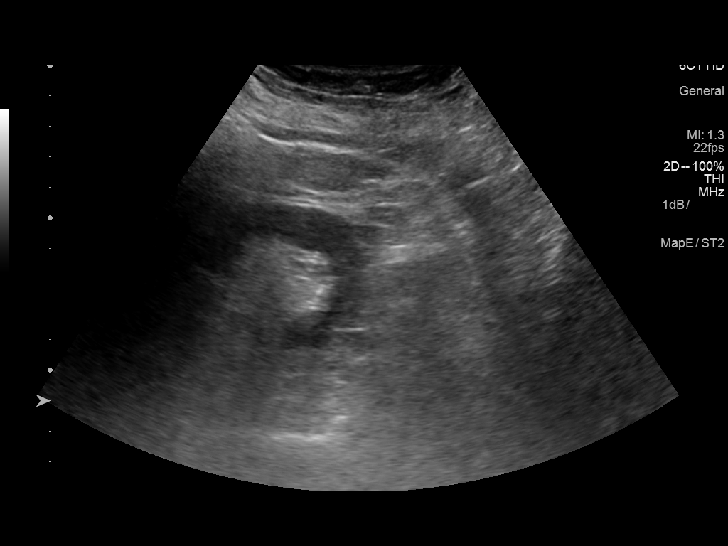
[im 42/46]
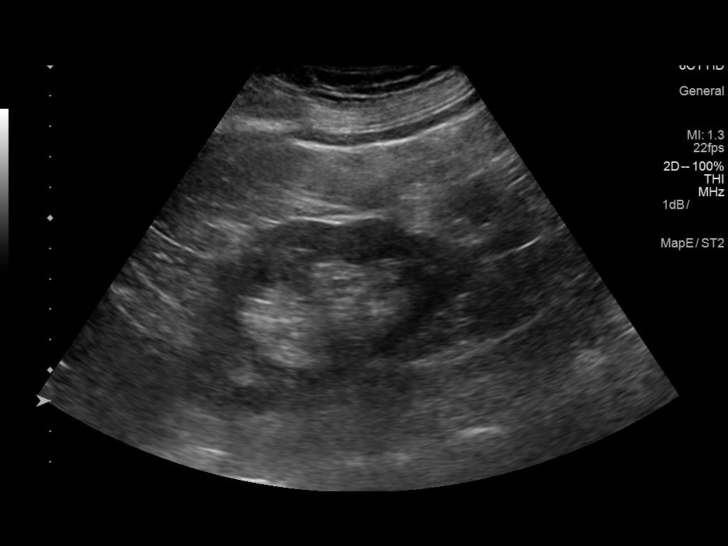
[im 46/46]
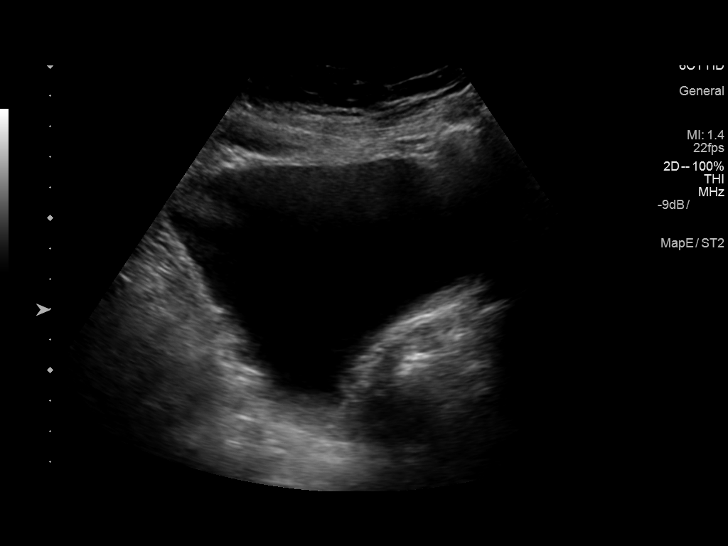

[14 of 25 positions shown; findings below may reference images not displayed]

FINDINGS: Right Kidney:

Length: 11 cm. Echogenicity within normal limits. No solid mass or
hydronephrosis visualized. 27 mm hilar cyst.

Left Kidney:

Length: 11 cm. Echogenicity within normal limits. No mass or
hydronephrosis visualized.

Bladder:

Appears normal for degree of bladder distention. No internal debris.
IMPRESSION: 1. No acute or significant finding.
2. Right renal cyst.

## 2017-03-02 DIAGNOSIS — L814 Other melanin hyperpigmentation: Secondary | ICD-10-CM | POA: Diagnosis not present

## 2017-03-02 DIAGNOSIS — L905 Scar conditions and fibrosis of skin: Secondary | ICD-10-CM | POA: Diagnosis not present

## 2017-03-02 DIAGNOSIS — L820 Inflamed seborrheic keratosis: Secondary | ICD-10-CM | POA: Diagnosis not present

## 2017-03-02 DIAGNOSIS — D229 Melanocytic nevi, unspecified: Secondary | ICD-10-CM | POA: Diagnosis not present

## 2017-03-02 DIAGNOSIS — L821 Other seborrheic keratosis: Secondary | ICD-10-CM | POA: Diagnosis not present

## 2017-03-17 ENCOUNTER — Ambulatory Visit: Payer: Medicare Other | Admitting: Podiatry

## 2017-03-31 ENCOUNTER — Ambulatory Visit (INDEPENDENT_AMBULATORY_CARE_PROVIDER_SITE_OTHER): Payer: Medicare Other | Admitting: Podiatry

## 2017-03-31 ENCOUNTER — Encounter: Payer: Self-pay | Admitting: Podiatry

## 2017-03-31 DIAGNOSIS — M79676 Pain in unspecified toe(s): Secondary | ICD-10-CM | POA: Diagnosis not present

## 2017-03-31 DIAGNOSIS — B351 Tinea unguium: Secondary | ICD-10-CM

## 2017-03-31 DIAGNOSIS — E0842 Diabetes mellitus due to underlying condition with diabetic polyneuropathy: Secondary | ICD-10-CM

## 2017-03-31 NOTE — Progress Notes (Signed)
   SUBJECTIVE Patient with a history of diabetes mellitus presents to office today complaining of elongated, thickened nails that cause pain while ambulating in shoes. She is unable to trim her own nails. Patient is here for further evaluation and treatment.   Past Medical History:  Diagnosis Date  . Cystocele   . Dementia   . Depressed   . Diabetes mellitus   . Endometriosis   . Hypertension   . Rectocele   . Thyroid disease   . Urinary incontinence    USI    OBJECTIVE General Patient is awake, alert, and oriented x 3 and in no acute distress. Derm Skin is dry and supple bilateral. Negative open lesions or macerations. Remaining integument unremarkable. Nails are tender, long, thickened and dystrophic with subungual debris, consistent with onychomycosis, 1-5 bilateral. No signs of infection noted. Vasc  DP and PT pedal pulses palpable bilaterally. Temperature gradient within normal limits.  Neuro Epicritic and protective threshold sensation diminished bilaterally.  Musculoskeletal Exam No symptomatic pedal deformities noted bilateral. Muscular strength within normal limits.  ASSESSMENT 1. Diabetes Mellitus w/ peripheral neuropathy 2. Onychomycosis of nail due to dermatophyte bilateral 3. Pain in foot bilateral  PLAN OF CARE 1. Patient evaluated today. 2. Instructed to maintain good pedal hygiene and foot care. Stressed importance of controlling blood sugar.  3. Mechanical debridement of nails 1-5 bilaterally performed using a nail nipper. Filed with dremel without incident.  4. Return to clinic in 3 mos.     Dru Primeau M. Mahlon Gabrielle, DPM [facility]  Dr. Jenna Ardoin M. Rexine Gowens, DPM    [address]                                        , [place] [address]                Office [phone]  Fax [phone]      

## 2017-04-04 ENCOUNTER — Emergency Department
Admission: EM | Admit: 2017-04-04 | Discharge: 2017-04-05 | Disposition: A | Discharge status: Home / Self Care | Payer: Medicare Other | Attending: Emergency Medicine | Admitting: Emergency Medicine

## 2017-04-04 ENCOUNTER — Other Ambulatory Visit: Payer: Self-pay

## 2017-04-04 DIAGNOSIS — N390 Urinary tract infection, site not specified: Secondary | ICD-10-CM | POA: Diagnosis not present

## 2017-04-04 DIAGNOSIS — Z79899 Other long term (current) drug therapy: Secondary | ICD-10-CM | POA: Diagnosis not present

## 2017-04-04 DIAGNOSIS — D72829 Elevated white blood cell count, unspecified: Secondary | ICD-10-CM | POA: Diagnosis not present

## 2017-04-04 DIAGNOSIS — I959 Hypotension, unspecified: Secondary | ICD-10-CM | POA: Diagnosis not present

## 2017-04-04 DIAGNOSIS — E119 Type 2 diabetes mellitus without complications: Secondary | ICD-10-CM | POA: Diagnosis not present

## 2017-04-04 DIAGNOSIS — F0390 Unspecified dementia without behavioral disturbance: Secondary | ICD-10-CM | POA: Insufficient documentation

## 2017-04-04 DIAGNOSIS — R112 Nausea with vomiting, unspecified: Secondary | ICD-10-CM | POA: Diagnosis not present

## 2017-04-04 DIAGNOSIS — Z7984 Long term (current) use of oral hypoglycemic drugs: Secondary | ICD-10-CM | POA: Insufficient documentation

## 2017-04-04 DIAGNOSIS — Z7982 Long term (current) use of aspirin: Secondary | ICD-10-CM | POA: Diagnosis not present

## 2017-04-04 DIAGNOSIS — Z91013 Allergy to seafood: Secondary | ICD-10-CM | POA: Insufficient documentation

## 2017-04-04 DIAGNOSIS — R4182 Altered mental status, unspecified: Secondary | ICD-10-CM | POA: Diagnosis not present

## 2017-04-04 DIAGNOSIS — R1110 Vomiting, unspecified: Secondary | ICD-10-CM

## 2017-04-04 DIAGNOSIS — R319 Hematuria, unspecified: Secondary | ICD-10-CM | POA: Insufficient documentation

## 2017-04-04 LAB — CBC
HCT: 41.5 % (ref 35.0–47.0)
HEMOGLOBIN: 13.7 g/dL (ref 12.0–16.0)
MCH: 30.6 pg (ref 26.0–34.0)
MCHC: 33.1 g/dL (ref 32.0–36.0)
MCV: 92.5 fL (ref 80.0–100.0)
Platelets: 210 10*3/uL (ref 150–440)
RBC: 4.49 MIL/uL (ref 3.80–5.20)
RDW: 13 % (ref 11.5–14.5)
WBC: 12 10*3/uL — AB (ref 3.6–11.0)

## 2017-04-04 LAB — COMPREHENSIVE METABOLIC PANEL
ALK PHOS: 50 U/L (ref 38–126)
ALT: 11 U/L — AB (ref 14–54)
AST: 19 U/L (ref 15–41)
Albumin: 3.8 g/dL (ref 3.5–5.0)
Anion gap: 11 (ref 5–15)
BUN: 29 mg/dL — ABNORMAL HIGH (ref 6–20)
CALCIUM: 8.7 mg/dL — AB (ref 8.9–10.3)
CO2: 25 mmol/L (ref 22–32)
CREATININE: 0.93 mg/dL (ref 0.44–1.00)
Chloride: 103 mmol/L (ref 101–111)
GFR, EST NON AFRICAN AMERICAN: 60 mL/min — AB (ref >60)
Glucose, Bld: 166 mg/dL — ABNORMAL HIGH (ref 65–99)
Potassium: 3.9 mmol/L (ref 3.5–5.1)
Sodium: 139 mmol/L (ref 135–145)
Total Bilirubin: 1 mg/dL (ref 0.3–1.2)
Total Protein: 7.1 g/dL (ref 6.5–8.1)

## 2017-04-04 LAB — URINALYSIS, COMPLETE (UACMP) WITH MICROSCOPIC
BILIRUBIN URINE: NEGATIVE
Glucose, UA: NEGATIVE mg/dL
KETONES UR: NEGATIVE mg/dL
Nitrite: POSITIVE — AB
PH: 5 (ref 5.0–8.0)
Protein, ur: 30 mg/dL — AB
SQUAMOUS EPITHELIAL / LPF: NONE SEEN
Specific Gravity, Urine: 1.017 (ref 1.005–1.030)

## 2017-04-04 LAB — TROPONIN I

## 2017-04-04 MED ORDER — ONDANSETRON HCL 4 MG/2ML IJ SOLN
4.0000 mg | Freq: Once | INTRAMUSCULAR | Status: AC
  Administered 2017-04-04: 4 mg via INTRAVENOUS
  Filled 2017-04-04: qty 2

## 2017-04-04 MED ORDER — CEPHALEXIN 500 MG PO CAPS: 500.0000 mg | ORAL_CAPSULE | Freq: Two times a day (BID) | ORAL | 0 refills | Status: DC

## 2017-04-04 MED ORDER — CEFTRIAXONE SODIUM 1 G IJ SOLR
1.0000 g | Freq: Once | INTRAMUSCULAR | Status: AC
  Administered 2017-04-04: 1 g via INTRAVENOUS
  Filled 2017-04-04: qty 10

## 2017-04-04 MED ORDER — SODIUM CHLORIDE 0.9 % IV BOLUS
1000.0000 mL | Freq: Once | INTRAVENOUS | Status: AC
  Administered 2017-04-04: 1000 mL via INTRAVENOUS

## 2017-04-04 NOTE — ED Triage Notes (Signed)
Pt arrives via ems from memory care at [facility], pt was reported to have vomited twice and has pinpoint pupils according to staff as well as being hypotensive. Ems reports getting a pressure of 131/78 and 123/70. No distress noted

## 2017-04-04 NOTE — ED Notes (Signed)
HCPOA is pt's friend. At bedside.

## 2017-04-04 NOTE — ED Provider Notes (Signed)
[facility] Emergency Department Provider Note  Time seen: 6:40 PM  I have reviewed the triage vital signs and the nursing notes.   HISTORY  Chief Complaint Emesis    HPI Frances [name] is a 72 y.o. female with a past medical history of dementia, diabetes, presents to the emergency department from her nursing facility/memory care unit for reported vomiting and hypotension.  According to EMS report patient had 2 episodes of vomiting at the nursing facility, they checked her blood pressure noted to be hypotensive around 70 systolic.  EMS states upon their arrival the patient appeared well, sitting up, no distress with a blood pressure 387 systolic.  Patient's friend who is her power of attorney is here with her, states unfortunately due to dementia the patient cannot answer questions regarding today's events.  Unable to answer review of systems questioning.  Patient is somewhat confused, answers inappropriately when asked questions, power of attorney states this is baseline.  Overall the patient is well-appearing, calm, cooperative no distress lying in bed comfortably.   Past Medical History:  Diagnosis Date  . Cystocele   . Dementia   . Depressed   . Diabetes mellitus   . Endometriosis   . Hypertension   . Rectocele   . Thyroid disease   . Urinary incontinence    USI    Patient Active Problem List   Diagnosis Date Noted  . Bacteremia 08/27/2015  . Altered mental status   . Sepsis ([place]) 08/23/2015  . UTI (lower urinary tract infection) 08/23/2015  . Leukocytosis 08/23/2015  . Fever 08/23/2015  . Urinary incontinence   . Cystocele   . Rectocele   . Hypothyroid   . Endometriosis   . Dementia   . Diabetes [facility])     Past Surgical History:  Procedure Laterality Date  . basal cell excised    . BLADDER SUSPENSION     A/P Repair-Sling  . HEMORRHOID SURGERY    . KNEE SURGERY    . OOPHORECTOMY  1992   BSO  . TONSILLECTOMY    . VAGINAL  HYSTERECTOMY  1992   Vag Hyst BSO    Prior to Admission medications   Medication Sig Start Date End Date Taking? Authorizing Provider  acetaminophen (TYLENOL) 325 MG tablet Take 2 tablets (650 mg total) by mouth every 4 (four) hours as needed for mild pain (or Fever >/= 101). 08/28/15   Eulogio Bear U, DO  amLODipine (NORVASC) 10 MG tablet Take 10 mg by mouth daily.  01/18/14   [provider]  aspirin EC 81 MG tablet Take 81 mg by mouth at bedtime.     [provider]  atorvastatin (LIPITOR) 20 MG tablet Take 20 mg by mouth daily.    [provider]  cholecalciferol (VITAMIN D) 400 UNITS TABS tablet Take 400 Units by mouth daily.     [provider]  ciprofloxacin (CIPRO) 500 MG tablet Take 1 tablet (500 mg total) by mouth 2 (two) times daily. 08/28/15   Geradine Girt, DO  co-enzyme Q-10 30 MG capsule Take 30 mg by mouth daily.    [provider]  donepezil (ARICEPT) 23 MG TABS tablet Take 23 mg by mouth at bedtime.  07/31/15   [provider]  escitalopram (LEXAPRO) 10 MG tablet TAKE 1 TABLET DAILY Patient taking differently: TAKE 1 BY MOUTH TABLET DAILY 07/18/15   Star Age, MD  fosinopril (MONOPRIL) 40 MG tablet Take 40 mg by mouth daily.  [provider]  levothyroxine (SYNTHROID, LEVOTHROID) 75 MCG tablet Take 75 mcg by mouth daily.    [provider]  Memantine HCl ER (NAMENDA XR) 28 MG CP24 Take 1 capsule by mouth daily.    [provider]  metFORMIN (GLUCOPHAGE) 1000 MG tablet Take 1,000 mg by mouth daily with breakfast.  07/07/14   [provider]  metroNIDAZOLE (FLAGYL) 500 MG tablet Take 1 tablet (500 mg total) by mouth every 8 (eight) hours. 08/28/15   Geradine Girt, DO  saccharomyces boulardii (FLORASTOR) 250 MG capsule Take 1 capsule (250 mg total) by mouth 2 (two) times daily. 08/28/15   Geradine Girt, DO  vitamin E (VITAMIN E) 1000 UNIT capsule Take 2,000 Units by mouth daily.     [provider]    Allergies  Allergen Reactions  . Shellfish Allergy Anaphylaxis  . Demerol [Meperidine] Nausea And Vomiting    Family History  Problem Relation Age of Onset  . Hypertension Mother   . Stroke Mother   . Heart failure Mother   . Hypertension Father   . Diabetes Sister   . Hypertension Sister   . Breast cancer Sister   . Heart disease Sister     Social History Social History   Tobacco Use  . Smoking status: Never Smoker  . Smokeless tobacco: Never Used  Substance Use Topics  . Alcohol use: No  . Drug use: Not on file    Review of Systems Unable to obtain an adequate review of systems secondary to dementia ____________________________________________   PHYSICAL EXAM:  VITAL SIGNS: ED Triage Vitals  Enc Vitals Group     BP 04/04/17 1809 134/76     Pulse Rate 04/04/17 1809 98     Resp 04/04/17 1809 18     Temp 04/04/17 1809 99 F (37.2 C)     Temp Source 04/04/17 1809 Axillary     SpO2 04/04/17 1804 97 %     Weight 04/04/17 1811 150 lb 12.8 oz (68.4 kg)     Height 04/04/17 1811 5\' 7"  (1.702 m)     Head Circumference --      Peak Flow --      Pain Score --      Pain Loc --      Pain Edu? --      Excl. in [place]? --     Constitutional: Alert, no distress, overall well-appearing. Eyes: Normal exam ENT   Head: Normocephalic and atraumatic.   Nose: No congestion/rhinnorhea.   Mouth/Throat: Mucous membranes are moist. Cardiovascular: Normal rate, regular rhythm.  Respiratory: Normal respiratory effort without tachypnea nor retractions. Breath sounds are clear  Gastrointestinal: Soft and nontender. No distention.  Musculoskeletal: Nontender with normal range of motion in all extremities. No lower extremity tenderness or edema.  Neurologic:  Normal speech and language. No gross focal neurologic deficits  Skin:  Skin is warm, dry and intact.  Psychiatric: Mood and affect are normal.    ____________________________________________    EKG  EKG reviewed and interpreted by myself shows normal sinus rhythm at 92 bpm, narrow QRS, normal axis, largely normal intervals with nonspecific but no concerning ST changes.  ____________________________________________    INITIAL IMPRESSION / ASSESSMENT AND PLAN / ED COURSE  Pertinent labs & imaging results that were available during my care of the patient were reviewed by me and considered in my medical decision making (see chart for details).  Patient presents to the emergency department with reported nausea vomiting x2  at her nursing facility and possible hypotension.  Patient has been normotensive for EMS as well as in the emergency department.  She is calm, cooperative, no distress.  No reaction to abdominal palpation.  We will check labs, urinalysis, IV hydrate and treat with Zofran, we will continue to closely monitor.  Differential would include vagal episode, hypotension, gastroenteritis, enteritis, less likely ACS, left light/metabolic abnormality or infectious etiology.  Patient's labs have resulted showing largely normal blood work besides a slight leukocytosis.  Urinalysis however is consistent with significant urinary tract infection.  I have added on a urine culture the patient's urine.  We will dose IV Rocephin in the emergency department.  Patient is currently eating and drinking, as long as she continues to tolerate p.o. well anticipate likely discharge home with oral antibiotics to her nursing facility.  Family agreeable to this plan of care.  ____________________________________________   FINAL CLINICAL IMPRESSION(S) / ED DIAGNOSES  Nausea vomiting Urinary tract infection    Harvest Dark, MD 04/04/17 2138

## 2017-04-06 LAB — URINE CULTURE

## 2017-05-11 IMAGING — CT CT CERVICAL SPINE W/O CM
3 of 5 series · 11 of 33 positions shown, 13 images · non-contrast
Comparison: 02/10/2012 and prior MRs

CLINICAL DATA: 69-year-old female with acute syncope, small and
head and neck injury today. Initial encounter.

EXAM:
CT HEAD WITHOUT CONTRAST
CT CERVICAL SPINE WITHOUT CONTRAST
TECHNIQUE: Multidetector CT imaging of the head and cervical spine was
performed following the standard protocol without intravenous
contrast. Multiplanar CT image reconstructions of the cervical spine
were also generated.

[Series 5: cervical st 2.0 b31s · axial · 0.22mm/px · z∈[+110,+212]mm · 3 of 85 slices shown, 4 images]
[im 17/85  soft-tissue]
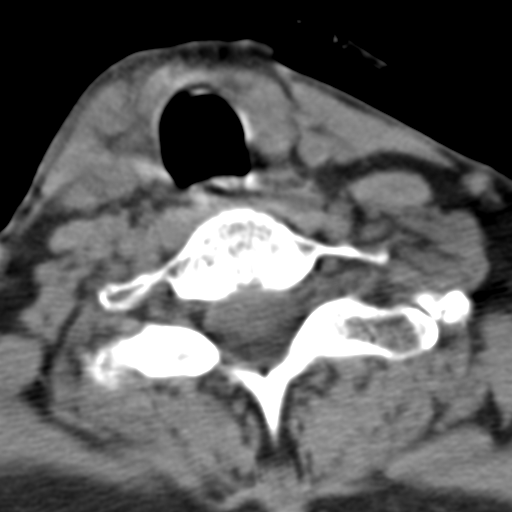
[im 17/85  bone]
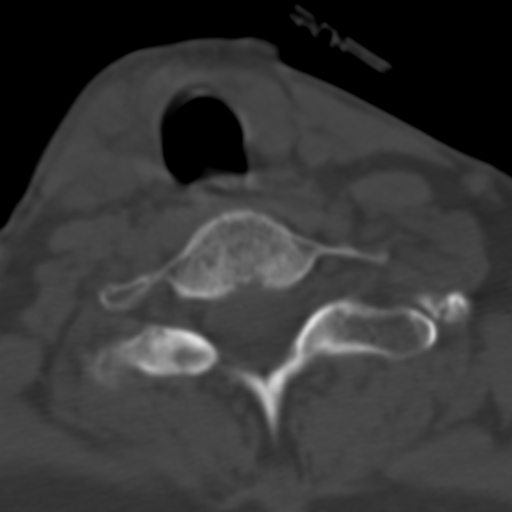
[im 51/85  bone]
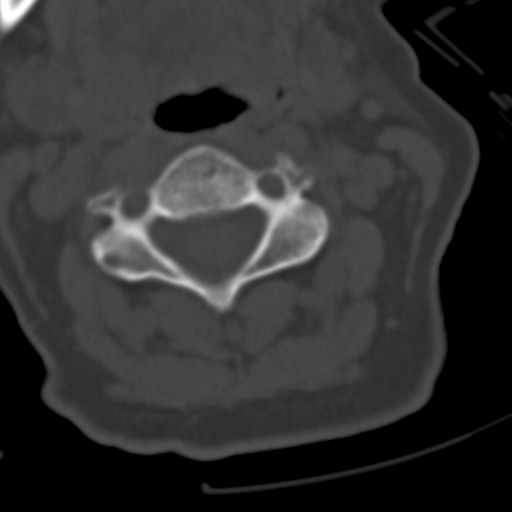
[im 68/85  bone]
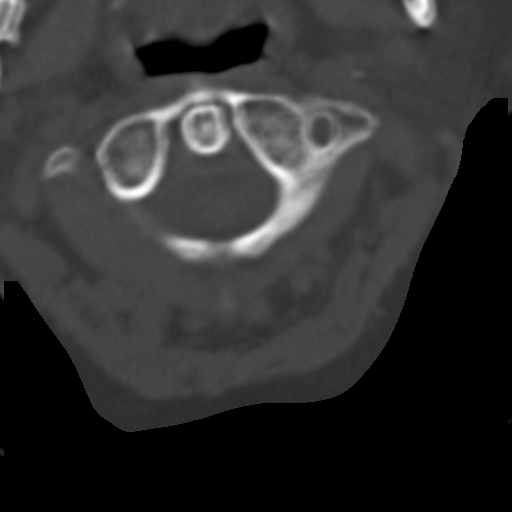

[Series 7: sagittal bone 2.0 · sagittal · 0.21mm/px · 5 of 39 slices shown, 6 images]
[im 13/39  bone]
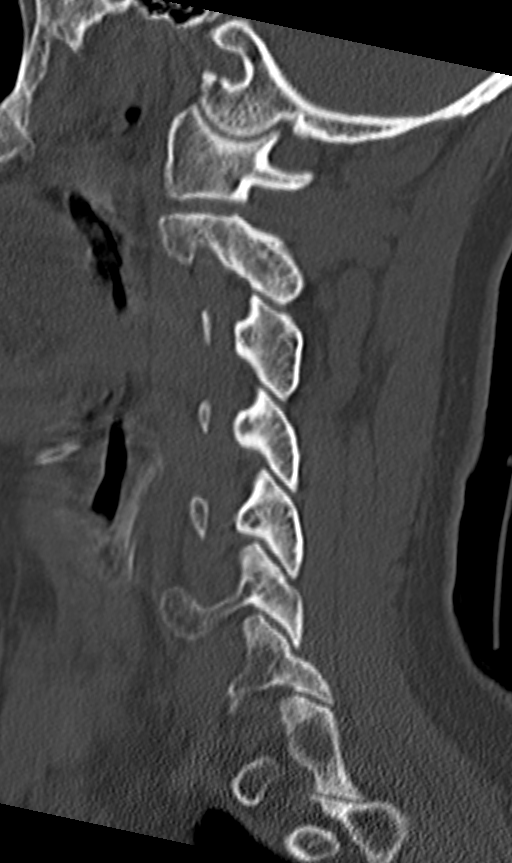
[im 16/39  bone]
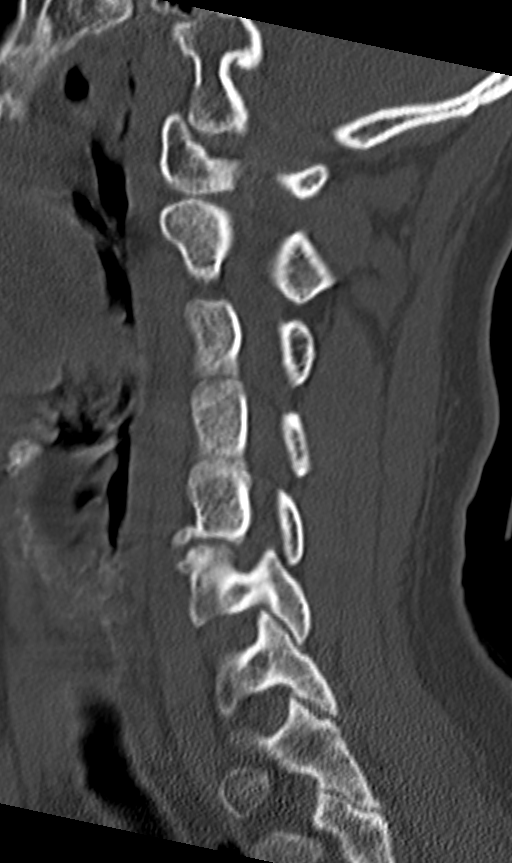
[im 20/39  soft-tissue]
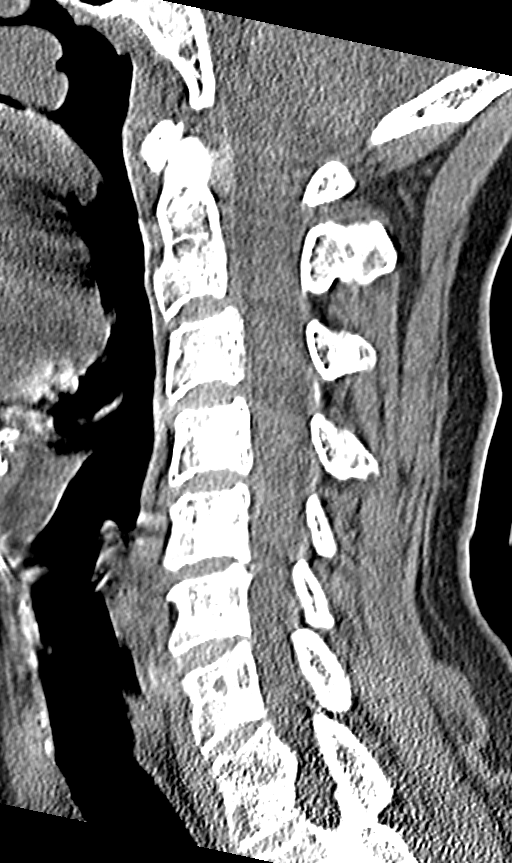
[im 20/39  bone]
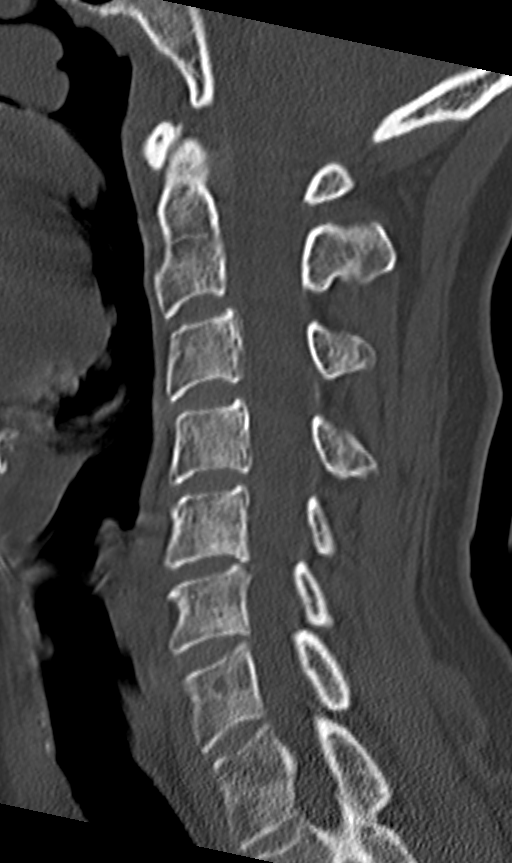
[im 23/39  bone]
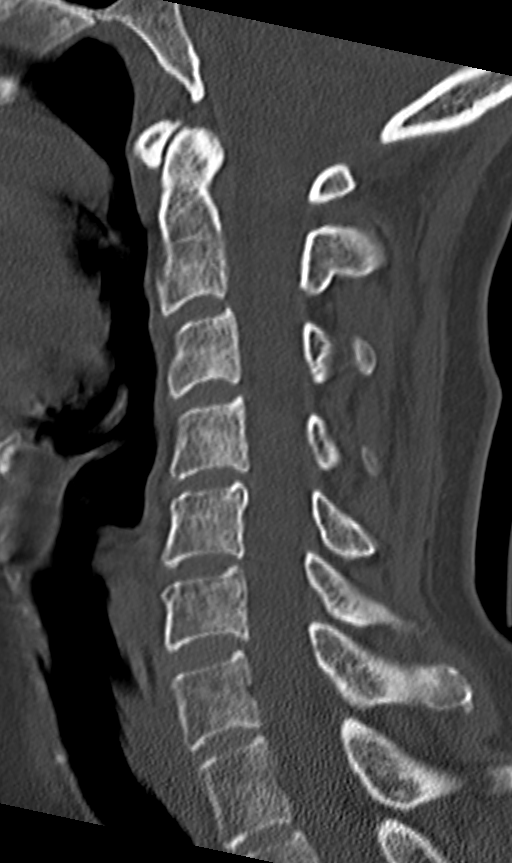
[im 26/39  bone]
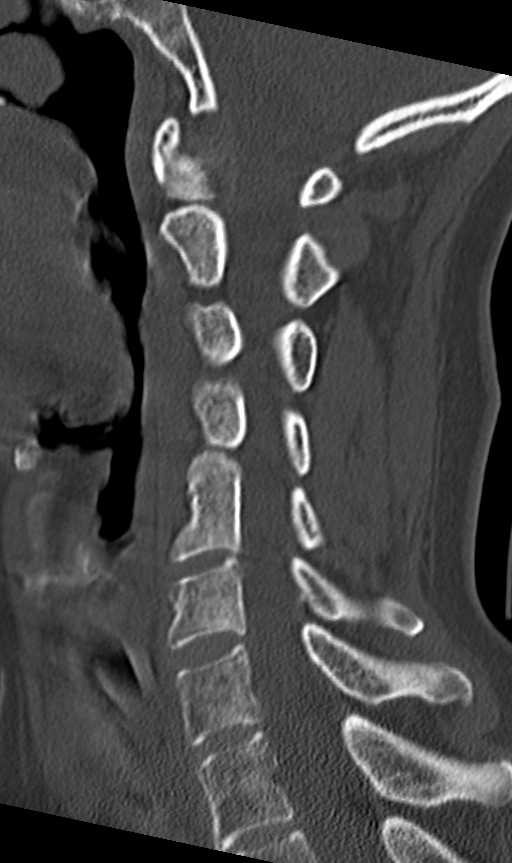

[Series 8: coronal bone 2.0 · coronal · 0.19mm/px · 3 of 51 slices shown]
[im 11/51  bone]
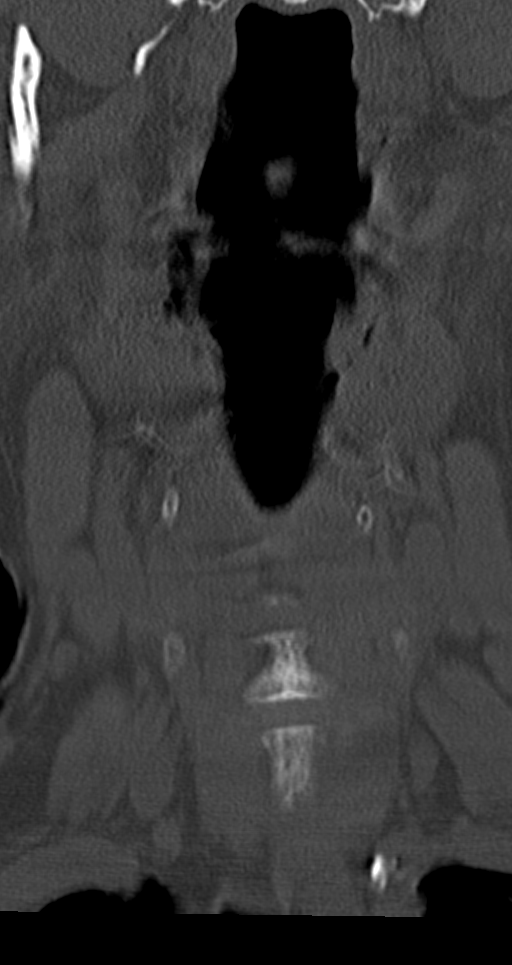
[im 21/51  bone]
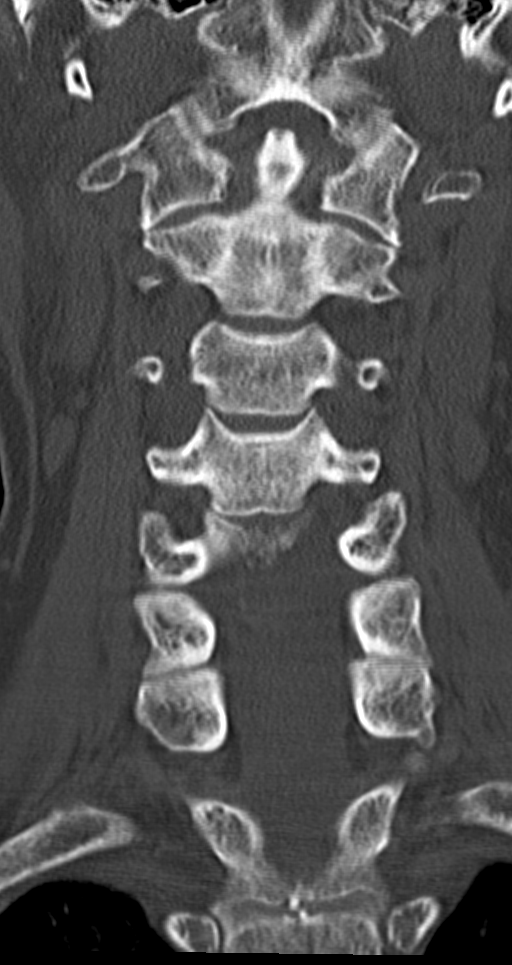
[im 31/51  bone]
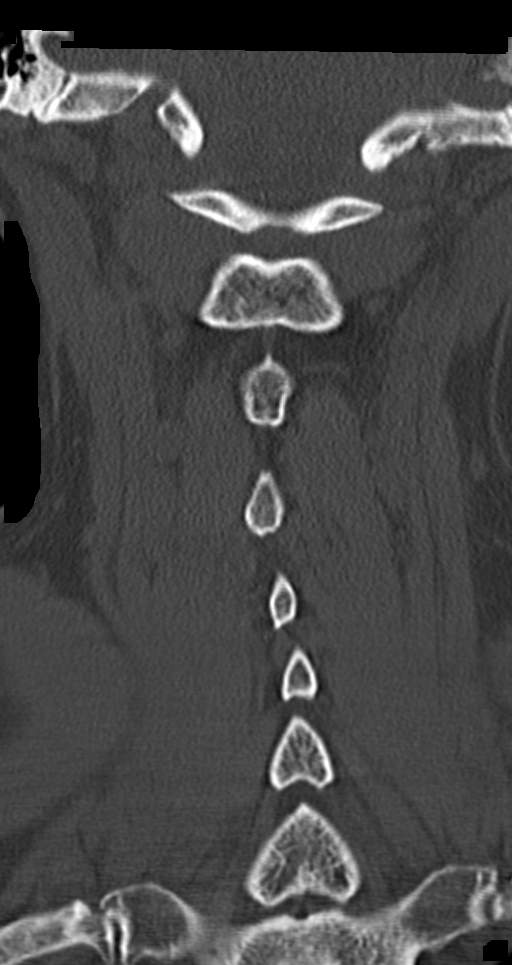

[11 of 33 positions shown; findings below may reference images not displayed]

FINDINGS: CT HEAD FINDINGS

Mild atrophy and chronic small-vessel white matter ischemic changes
again noted.

No acute intracranial abnormalities are identified, including mass
lesion or mass effect, hydrocephalus, extra-axial fluid collection,
midline shift, hemorrhage, or acute infarction. The visualized bony
calvarium is unremarkable.

Right posterior scalp soft tissue swelling is identified.

CT CERVICAL SPINE FINDINGS

Straightening of the normal cervical lordosis is noted.

There is no evidence of acute fracture, subluxation or prevertebral
soft tissue swelling.

The disc spaces are maintained.

No focal bony lesions are identified.

The soft tissue structures are unremarkable.
IMPRESSION: Right posterior scalp soft tissue swelling without fracture.

No evidence of acute intracranial abnormality. Mild atrophy and
chronic small-vessel white matter ischemic changes.

No static evidence of acute injury to cervical spine.

## 2017-06-04 DIAGNOSIS — E119 Type 2 diabetes mellitus without complications: Secondary | ICD-10-CM | POA: Diagnosis not present

## 2017-06-04 DIAGNOSIS — I10 Essential (primary) hypertension: Secondary | ICD-10-CM | POA: Diagnosis not present

## 2017-06-04 DIAGNOSIS — H35033 Hypertensive retinopathy, bilateral: Secondary | ICD-10-CM | POA: Diagnosis not present

## 2017-06-04 DIAGNOSIS — H25813 Combined forms of age-related cataract, bilateral: Secondary | ICD-10-CM | POA: Diagnosis not present

## 2017-06-08 DIAGNOSIS — E1169 Type 2 diabetes mellitus with other specified complication: Secondary | ICD-10-CM | POA: Diagnosis not present

## 2017-06-08 DIAGNOSIS — I10 Essential (primary) hypertension: Secondary | ICD-10-CM | POA: Diagnosis not present

## 2017-06-08 DIAGNOSIS — E7800 Pure hypercholesterolemia, unspecified: Secondary | ICD-10-CM | POA: Diagnosis not present

## 2017-06-08 DIAGNOSIS — Z7984 Long term (current) use of oral hypoglycemic drugs: Secondary | ICD-10-CM | POA: Diagnosis not present

## 2017-07-02 DIAGNOSIS — H2513 Age-related nuclear cataract, bilateral: Secondary | ICD-10-CM | POA: Diagnosis not present

## 2017-07-03 ENCOUNTER — Ambulatory Visit: Payer: Medicare Other | Admitting: Podiatry

## 2017-07-07 ENCOUNTER — Ambulatory Visit (INDEPENDENT_AMBULATORY_CARE_PROVIDER_SITE_OTHER): Payer: Medicare Other | Admitting: Podiatry

## 2017-07-07 ENCOUNTER — Encounter: Payer: Self-pay | Admitting: Podiatry

## 2017-07-07 DIAGNOSIS — M79676 Pain in unspecified toe(s): Secondary | ICD-10-CM

## 2017-07-07 DIAGNOSIS — B351 Tinea unguium: Secondary | ICD-10-CM | POA: Diagnosis not present

## 2017-07-07 DIAGNOSIS — E0842 Diabetes mellitus due to underlying condition with diabetic polyneuropathy: Secondary | ICD-10-CM

## 2017-07-13 NOTE — Progress Notes (Signed)
   SUBJECTIVE Patient with a history of diabetes mellitus presents to office today complaining of elongated, thickened nails that cause pain while ambulating in shoes. She is unable to trim her own nails. Patient is here for further evaluation and treatment.   Past Medical History:  Diagnosis Date  . Cystocele   . Dementia   . Depressed   . Diabetes mellitus   . Endometriosis   . Hypertension   . Rectocele   . Thyroid disease   . Urinary incontinence    USI    OBJECTIVE General Patient is awake, alert, and oriented x 3 and in no acute distress. Derm Skin is dry and supple bilateral. Negative open lesions or macerations. Remaining integument unremarkable. Nails are tender, long, thickened and dystrophic with subungual debris, consistent with onychomycosis, 1-5 bilateral. No signs of infection noted. Vasc  DP and PT pedal pulses palpable bilaterally. Temperature gradient within normal limits.  Neuro Epicritic and protective threshold sensation diminished bilaterally.  Musculoskeletal Exam No symptomatic pedal deformities noted bilateral. Muscular strength within normal limits.  ASSESSMENT 1. Diabetes Mellitus w/ peripheral neuropathy 2. Onychomycosis of nail due to dermatophyte bilateral 3. Pain in foot bilateral  PLAN OF CARE 1. Patient evaluated today. 2. Instructed to maintain good pedal hygiene and foot care. Stressed importance of controlling blood sugar.  3. Mechanical debridement of nails 1-5 bilaterally performed using a nail nipper. Filed with dremel without incident.  4. Return to clinic in 3 mos.     Edrick Kins, DPM [facility]  Dr. Edrick Kins, [address]                                        [place], [place] [address]                Office [phone]  Fax [phone]

## 2017-08-06 ENCOUNTER — Other Ambulatory Visit: Payer: Self-pay

## 2017-08-06 ENCOUNTER — Emergency Department
Admission: EM | Admit: 2017-08-06 | Discharge: 2017-08-06 | Disposition: A | Discharge status: Home / Self Care | Payer: Medicare Other | Attending: Emergency Medicine | Admitting: Emergency Medicine

## 2017-08-06 DIAGNOSIS — Z85828 Personal history of other malignant neoplasm of skin: Secondary | ICD-10-CM | POA: Insufficient documentation

## 2017-08-06 DIAGNOSIS — Z7984 Long term (current) use of oral hypoglycemic drugs: Secondary | ICD-10-CM | POA: Diagnosis not present

## 2017-08-06 DIAGNOSIS — E860 Dehydration: Secondary | ICD-10-CM | POA: Diagnosis not present

## 2017-08-06 DIAGNOSIS — N3090 Cystitis, unspecified without hematuria: Secondary | ICD-10-CM | POA: Insufficient documentation

## 2017-08-06 DIAGNOSIS — F0390 Unspecified dementia without behavioral disturbance: Secondary | ICD-10-CM | POA: Diagnosis not present

## 2017-08-06 DIAGNOSIS — Z79899 Other long term (current) drug therapy: Secondary | ICD-10-CM | POA: Insufficient documentation

## 2017-08-06 DIAGNOSIS — R339 Retention of urine, unspecified: Secondary | ICD-10-CM | POA: Diagnosis not present

## 2017-08-06 DIAGNOSIS — E039 Hypothyroidism, unspecified: Secondary | ICD-10-CM | POA: Diagnosis not present

## 2017-08-06 DIAGNOSIS — E119 Type 2 diabetes mellitus without complications: Secondary | ICD-10-CM | POA: Insufficient documentation

## 2017-08-06 DIAGNOSIS — I10 Essential (primary) hypertension: Secondary | ICD-10-CM | POA: Diagnosis not present

## 2017-08-06 DIAGNOSIS — Z7982 Long term (current) use of aspirin: Secondary | ICD-10-CM | POA: Diagnosis not present

## 2017-08-06 LAB — URINALYSIS, COMPLETE (UACMP) WITH MICROSCOPIC
Bilirubin Urine: NEGATIVE
Glucose, UA: NEGATIVE mg/dL
HGB URINE DIPSTICK: NEGATIVE
Ketones, ur: 5 mg/dL — AB
Nitrite: POSITIVE — AB
Protein, ur: NEGATIVE mg/dL
SPECIFIC GRAVITY, URINE: 1.019 (ref 1.005–1.030)
SQUAMOUS EPITHELIAL / LPF: NONE SEEN (ref 0–5)
pH: 5 (ref 5.0–8.0)

## 2017-08-06 LAB — BASIC METABOLIC PANEL
Anion gap: 7 (ref 5–15)
BUN: 19 mg/dL (ref 8–23)
CHLORIDE: 103 mmol/L (ref 98–111)
CO2: 30 mmol/L (ref 22–32)
Calcium: 9.1 mg/dL (ref 8.9–10.3)
Creatinine, Ser: 0.99 mg/dL (ref 0.44–1.00)
GFR calc non Af Amer: 56 mL/min — ABNORMAL LOW (ref >60)
Glucose, Bld: 133 mg/dL — ABNORMAL HIGH (ref 70–99)
POTASSIUM: 4.1 mmol/L (ref 3.5–5.1)
SODIUM: 140 mmol/L (ref 135–145)

## 2017-08-06 LAB — CBC
HEMATOCRIT: 40.3 % (ref 35.0–47.0)
HEMOGLOBIN: 14.1 g/dL (ref 12.0–16.0)
MCH: 32.3 pg (ref 26.0–34.0)
MCHC: 35 g/dL (ref 32.0–36.0)
MCV: 92.5 fL (ref 80.0–100.0)
Platelets: 246 10*3/uL (ref 150–440)
RBC: 4.36 MIL/uL (ref 3.80–5.20)
RDW: 13.1 % (ref 11.5–14.5)
WBC: 6.4 10*3/uL (ref 3.6–11.0)

## 2017-08-06 MED ORDER — CEPHALEXIN 500 MG PO CAPS: 500.0000 mg | ORAL_CAPSULE | Freq: Two times a day (BID) | ORAL | 0 refills | Status: DC

## 2017-08-06 MED ORDER — SODIUM CHLORIDE 0.9 % IV SOLN
1.0000 g | Freq: Once | INTRAVENOUS | Status: AC
  Administered 2017-08-06: 1 g via INTRAVENOUS
  Filled 2017-08-06: qty 10

## 2017-08-06 MED ORDER — SODIUM CHLORIDE 0.9 % IV BOLUS
1000.0000 mL | Freq: Once | INTRAVENOUS | Status: AC
  Administered 2017-08-06: 1000 mL via INTRAVENOUS

## 2017-08-06 NOTE — ED Provider Notes (Signed)
[facility] Emergency Department Provider Note  ____________________________________________  Time seen: Approximately 8:58 PM  I have reviewed the triage vital signs and the nursing notes.   HISTORY  Chief Complaint Urinary Retention  Level 5 Caveat: Portions of the History and Physical including HPI and review of systems are unable to be completely obtained due to patient being a poor historian due to chronic dementia   HPI Frances [name] is a 72 y.o. female with a history of dementia diabetes and hypertension who sent to the ED from her memory care unit due to decreased urine output for the past 2 days.  Family at bedside report they have not noticed any acute symptoms but were told the patient had a few episodes of diarrhea 3 to 4 days ago that is now resolved.  Patient has been eating well and at her baseline state of health as far as they can tell.  Bladder scan performed at triage showed 2 6 0 mL.      Past Medical History:  Diagnosis Date  . Cystocele   . Dementia   . Depressed   . Diabetes mellitus   . Endometriosis   . Hypertension   . Rectocele   . Thyroid disease   . Urinary incontinence    USI     Patient Active Problem List   Diagnosis Date Noted  . Bacteremia 08/27/2015  . Altered mental status   . Sepsis ([place]) 08/23/2015  . UTI (lower urinary tract infection) 08/23/2015  . Leukocytosis 08/23/2015  . Fever 08/23/2015  . Urinary incontinence   . Cystocele   . Rectocele   . Hypothyroid   . Endometriosis   . Dementia   . Diabetes [facility])      Past Surgical History:  Procedure Laterality Date  . basal cell excised    . BLADDER SUSPENSION     A/P Repair-Sling  . HEMORRHOID SURGERY    . KNEE SURGERY    . OOPHORECTOMY  1992   BSO  . TONSILLECTOMY    . VAGINAL HYSTERECTOMY  1992   Vag Hyst BSO     Prior to Admission medications   Medication Sig Start Date End Date Taking? Authorizing Provider  acetaminophen  (TYLENOL) 325 MG tablet Take 2 tablets (650 mg total) by mouth every 4 (four) hours as needed for mild pain (or Fever >/= 101). 08/28/15   Eulogio Bear U, DO  amLODipine (NORVASC) 10 MG tablet Take 10 mg by mouth daily.  01/18/14   [provider]  aspirin EC 81 MG tablet Take 81 mg by mouth at bedtime.     [provider]  atorvastatin (LIPITOR) 20 MG tablet Take 20 mg by mouth daily.    [provider]  cephALEXin (KEFLEX) 500 MG capsule Take 1 capsule (500 mg total) by mouth 2 (two) times daily. 08/06/17   Carrie Mew, MD  cholecalciferol (VITAMIN D) 400 UNITS TABS tablet Take 400 Units by mouth daily.     [provider]  ciprofloxacin (CIPRO) 500 MG tablet Take 1 tablet (500 mg total) by mouth 2 (two) times daily. 08/28/15   Geradine Girt, DO  co-enzyme Q-10 30 MG capsule Take 30 mg by mouth daily.    [provider]  donepezil (ARICEPT) 23 MG TABS tablet Take 23 mg by mouth at bedtime.  07/31/15   [provider]  escitalopram (LEXAPRO) 10 MG tablet TAKE 1 TABLET DAILY Patient taking differently: TAKE 1 BY MOUTH TABLET DAILY  07/18/15   Star Age, MD  fosinopril (MONOPRIL) 40 MG tablet Take 40 mg by mouth daily.    [provider]  levothyroxine (SYNTHROID, LEVOTHROID) 75 MCG tablet Take 75 mcg by mouth daily.    [provider]  Memantine HCl ER (NAMENDA XR) 28 MG CP24 Take 1 capsule by mouth daily.    [provider]  metFORMIN (GLUCOPHAGE) 1000 MG tablet Take 1,000 mg by mouth daily with breakfast.  07/07/14   [provider]  metroNIDAZOLE (FLAGYL) 500 MG tablet Take 1 tablet (500 mg total) by mouth every 8 (eight) hours. 08/28/15   Geradine Girt, DO  ONETOUCH VERIO test strip USE 1 STRIP TO CHECK GLUCOSE ONCE DAILY ALTERNATING AM AND PM BEFORE MEALS &AMP AS DIRECTED FINGER STICK FOR 90 DAYS 06/03/17   [provider]  saccharomyces boulardii (FLORASTOR) 250 MG capsule Take 1 capsule (250  mg total) by mouth 2 (two) times daily. 08/28/15   Geradine Girt, DO  vitamin E (VITAMIN E) 1000 UNIT capsule Take 2,000 Units by mouth daily.    [provider]     Allergies Shellfish allergy and Demerol [meperidine]   Family History  Problem Relation Age of Onset  . Hypertension Mother   . Stroke Mother   . Heart failure Mother   . Hypertension Father   . Diabetes Sister   . Hypertension Sister   . Breast cancer Sister   . Heart disease Sister     Social History Social History   Tobacco Use  . Smoking status: Never Smoker  . Smokeless tobacco: Never Used  Substance Use Topics  . Alcohol use: No  . Drug use: Not on file    Review of Systems  Level 5 Caveat: Portions of the History and Physical including HPI and review of systems are unable to be completely obtained due to patient being a poor historian related to dementia. Constitutional: No fever GI: No vomiting.  Recent diarrhea that is resolved.  Normal diet. Respiratory: No shortness of breath or cough.  ____________________________________________   PHYSICAL EXAM:  VITAL SIGNS: ED Triage Vitals  Enc Vitals Group     BP 08/06/17 1709 113/69     Pulse Rate 08/06/17 1709 87     Resp --      Temp 08/06/17 1709 97.8 F (36.6 C)     Temp Source 08/06/17 1709 Axillary     SpO2 08/06/17 1709 100 %     Weight 08/06/17 1710 147 lb (66.7 kg)     Height 08/06/17 1710 5\' 7"  (1.702 m)     Head Circumference --      Peak Flow --      Pain Score 08/06/17 1715 0     Pain Loc --      Pain Edu? --      Excl. in [place]? --     Vital signs reviewed, nursing assessments reviewed.   Constitutional:   Alert and oriented to self. Non-toxic appearance. Eyes:   Conjunctivae are normal. EOMI. PERRL. ENT      Head:   Normocephalic and atraumatic.      Nose:   No congestion/rhinnorhea.       Mouth/Throat:   Dry mucous membranes, no pharyngeal erythema. No peritonsillar mass.       Neck:   No meningismus. Full  ROM. Hematological/Lymphatic/Immunilogical:   No cervical lymphadenopathy. Cardiovascular:   RRR. Symmetric bilateral radial and DP pulses.  No murmurs. Cap refill less than 2  seconds. Respiratory:   Normal respiratory effort without tachypnea/retractions. Breath sounds are clear and equal bilaterally. No wheezes/rales/rhonchi. Gastrointestinal:   Soft and nontender. Non distended. No rebound, rigidity, or guarding. Musculoskeletal:   Normal range of motion in all extremities. No joint effusions.  No lower extremity tenderness.  No edema. Neurologic:   Normal speech, limited language range.  Motor grossly intact. No acute focal neurologic deficits are appreciated.  Skin:    Skin is warm, dry and intact. No rash noted.  No petechiae, purpura, or bullae.  ____________________________________________    LABS (pertinent positives/negatives) (all labs ordered are listed, but only abnormal results are displayed) Labs Reviewed  URINALYSIS, COMPLETE (UACMP) WITH MICROSCOPIC - Abnormal; Notable for the following components:      Result Value   Color, Urine YELLOW (*)    APPearance HAZY (*)    Ketones, ur 5 (*)    Nitrite POSITIVE (*)    Leukocytes, UA MODERATE (*)    Bacteria, UA MANY (*)    All other components within normal limits  BASIC METABOLIC PANEL - Abnormal; Notable for the following components:   Glucose, Bld 133 (*)    GFR calc non Af Amer 56 (*)    All other components within normal limits  URINE CULTURE  CBC   ____________________________________________   EKG    ____________________________________________    RADIOLOGY  No results found.  ____________________________________________   PROCEDURES Procedures  ____________________________________________    CLINICAL IMPRESSION / ASSESSMENT AND PLAN / ED COURSE  Pertinent labs & imaging results that were available during my care of the patient were reviewed by me and considered in my medical decision  making (see chart for details).    Patient sent for suspected urinary retention.  However bladder scan shows low bladder volume.  In and out cath was performed to strictly measure bladder volume which returned about 1 5 0 mL.  Send UA to the lab for analysis.  Vital signs are normal, exam is nonfocal, no acute symptoms.  Based on the recent diarrhea I suspect that she has become dehydrated, causing decreased urine output, and due to her dementia she is been unable to increase her fluid intake to rehydrate orally.  Serum labs are unremarkable without evidence of acute renal insufficiency or electrolyte disturbance.  I will give IV fluid saline bolus for hydration.  Clinical Course as of Aug 06 2056  Thu Aug 06, 2017  2000 UA positive for UTI. Will start abx. Non toxic, otherwise at baseline, suitable for DC back to facility to continue oral abx.    [PS]    Clinical Course User Index [PS] Carrie Mew, MD     ____________________________________________   FINAL CLINICAL IMPRESSION(S) / ED DIAGNOSES    Final diagnoses:  Dehydration  Cystitis     ED Discharge Orders        Ordered    cephALEXin (KEFLEX) 500 MG capsule  2 times daily     08/06/17 2058      Portions of this note were generated with dragon dictation software. Dictation errors may occur despite best attempts at proofreading.    Carrie Mew, MD 08/06/17 2102

## 2017-08-06 NOTE — ED Triage Notes (Signed)
Pt arrives from [place] with sister and friend. Pt lives at [facility]. States no urination x 2 days. Fast Med felt bladder and stated it felt full. No bladder scan performed. Will perform in triage. No distress noted in triage. Denies pain.

## 2017-08-06 NOTE — Discharge Instructions (Signed)
Your urine test shows a urinary tract infection. We gave you antibiotics here. Please start taking keflex as prescribed tomorrow.  Follow up with your doctor to continue monitoring your infection and ensure it resolves.  We sent a urine culture to confirm that the antibiotic will be effective. We will call if any changes to antibiotic therapy need to be made.

## 2017-08-06 NOTE — ED Notes (Signed)
216ml of urine noted on bladder scan.

## 2017-08-09 LAB — URINE CULTURE

## 2017-08-10 NOTE — Progress Notes (Signed)
Pharmacy Antibiotic Note  Frances [name] is a 72 y.o. female seen here in the ED on 08/06/2017 with diarrhea. She was discharged back to [facility] on 8/1 with cephalexin. A urine culture from that visit has grown ESBL. I spoke with Frances [name] at [facility] who is her nurse and told her that Frances [name] will need a different antibiotic and asks me to fax the new order to [phone].  Plan: Her culture was discussed with Dr Joni Fears who originally saw her here in the ED and I will fax a prescription for Monurol 3 grams x1.  Height: 5\' 7"  (170.2 cm) Weight: 147 lb (66.7 kg) IBW/kg (Calculated) : 61.6  No data recorded.  Recent Labs  Lab 08/06/17 1717  WBC 6.4  CREATININE 0.99    Estimated Creatinine Clearance: 50 mL/min (by C-G formula based on SCr of 0.99 mg/dL).    Microbiology results: 8/1 UCx: ESBL E coli   Thank you for allowing pharmacy to be a part of this patient's care.  Frances [name], PharmD 08/10/2017 1:45 PM

## 2017-10-06 ENCOUNTER — Ambulatory Visit: Payer: Medicare Other | Admitting: Podiatry

## 2017-10-06 DIAGNOSIS — Z1389 Encounter for screening for other disorder: Secondary | ICD-10-CM | POA: Diagnosis not present

## 2017-10-06 DIAGNOSIS — E1169 Type 2 diabetes mellitus with other specified complication: Secondary | ICD-10-CM | POA: Diagnosis not present

## 2017-10-06 DIAGNOSIS — F325 Major depressive disorder, single episode, in full remission: Secondary | ICD-10-CM | POA: Diagnosis not present

## 2017-10-06 DIAGNOSIS — Z79899 Other long term (current) drug therapy: Secondary | ICD-10-CM | POA: Diagnosis not present

## 2017-10-06 DIAGNOSIS — E1142 Type 2 diabetes mellitus with diabetic polyneuropathy: Secondary | ICD-10-CM | POA: Diagnosis not present

## 2017-10-06 DIAGNOSIS — Z0000 Encounter for general adult medical examination without abnormal findings: Secondary | ICD-10-CM | POA: Diagnosis not present

## 2017-10-06 DIAGNOSIS — Z23 Encounter for immunization: Secondary | ICD-10-CM | POA: Diagnosis not present

## 2017-10-06 DIAGNOSIS — I10 Essential (primary) hypertension: Secondary | ICD-10-CM | POA: Diagnosis not present

## 2017-10-06 DIAGNOSIS — E039 Hypothyroidism, unspecified: Secondary | ICD-10-CM | POA: Diagnosis not present

## 2017-10-06 DIAGNOSIS — G309 Alzheimer's disease, unspecified: Secondary | ICD-10-CM | POA: Diagnosis not present

## 2017-10-06 DIAGNOSIS — E7800 Pure hypercholesterolemia, unspecified: Secondary | ICD-10-CM | POA: Diagnosis not present

## 2017-10-09 ENCOUNTER — Encounter: Payer: Self-pay | Admitting: Podiatry

## 2017-10-09 ENCOUNTER — Ambulatory Visit (INDEPENDENT_AMBULATORY_CARE_PROVIDER_SITE_OTHER): Payer: Medicare Other | Admitting: Podiatry

## 2017-10-09 DIAGNOSIS — M79676 Pain in unspecified toe(s): Secondary | ICD-10-CM | POA: Diagnosis not present

## 2017-10-09 DIAGNOSIS — E0842 Diabetes mellitus due to underlying condition with diabetic polyneuropathy: Secondary | ICD-10-CM

## 2017-10-09 DIAGNOSIS — B351 Tinea unguium: Secondary | ICD-10-CM

## 2017-10-11 NOTE — Progress Notes (Signed)
   SUBJECTIVE Patient with a history of diabetes mellitus presents to office today complaining of elongated, thickened nails that cause pain while ambulating in shoes. She is unable to trim her own nails. Patient is here for further evaluation and treatment.   Past Medical History:  Diagnosis Date  . Cystocele   . Dementia (HCC)   . Depressed   . Diabetes mellitus   . Endometriosis   . Hypertension   . Rectocele   . Thyroid disease   . Urinary incontinence    USI    OBJECTIVE General Patient is awake, alert, and oriented x 3 and in no acute distress. Derm Skin is dry and supple bilateral. Negative open lesions or macerations. Remaining integument unremarkable. Nails are tender, long, thickened and dystrophic with subungual debris, consistent with onychomycosis, 1-5 bilateral. No signs of infection noted. Vasc  DP and PT pedal pulses palpable bilaterally. Temperature gradient within normal limits.  Neuro Epicritic and protective threshold sensation diminished bilaterally.  Musculoskeletal Exam No symptomatic pedal deformities noted bilateral. Muscular strength within normal limits.  ASSESSMENT 1. Diabetes Mellitus w/ peripheral neuropathy 2. Onychomycosis of nail due to dermatophyte bilateral 3. Pain in foot bilateral  PLAN OF CARE 1. Patient evaluated today. 2. Instructed to maintain good pedal hygiene and foot care. Stressed importance of controlling blood sugar.  3. Mechanical debridement of nails 1-5 bilaterally performed using a nail nipper. Filed with dremel without incident.  4. Return to clinic in 3 mos.     Yen Wandell M. Sherl Yzaguirre, DPM [facility]  Dr. Alejo Beamer M. Sreenidhi Ganson, DPM    [address]                                        [place], [place] [address]                Office [phone]  Fax [phone]      

## 2017-12-24 DIAGNOSIS — E1142 Type 2 diabetes mellitus with diabetic polyneuropathy: Secondary | ICD-10-CM | POA: Diagnosis not present

## 2017-12-24 DIAGNOSIS — N183 Chronic kidney disease, stage 3 (moderate): Secondary | ICD-10-CM | POA: Diagnosis not present

## 2017-12-24 DIAGNOSIS — I129 Hypertensive chronic kidney disease with stage 1 through stage 4 chronic kidney disease, or unspecified chronic kidney disease: Secondary | ICD-10-CM | POA: Diagnosis not present

## 2017-12-24 DIAGNOSIS — Z7984 Long term (current) use of oral hypoglycemic drugs: Secondary | ICD-10-CM | POA: Diagnosis not present

## 2017-12-24 DIAGNOSIS — F325 Major depressive disorder, single episode, in full remission: Secondary | ICD-10-CM | POA: Diagnosis not present

## 2017-12-24 DIAGNOSIS — E1122 Type 2 diabetes mellitus with diabetic chronic kidney disease: Secondary | ICD-10-CM | POA: Diagnosis not present

## 2017-12-24 DIAGNOSIS — F0280 Dementia in other diseases classified elsewhere without behavioral disturbance: Secondary | ICD-10-CM | POA: Diagnosis not present

## 2017-12-24 DIAGNOSIS — S31829D Unspecified open wound of left buttock, subsequent encounter: Secondary | ICD-10-CM | POA: Diagnosis not present

## 2017-12-24 DIAGNOSIS — G309 Alzheimer's disease, unspecified: Secondary | ICD-10-CM | POA: Diagnosis not present

## 2017-12-29 DIAGNOSIS — F0280 Dementia in other diseases classified elsewhere without behavioral disturbance: Secondary | ICD-10-CM | POA: Diagnosis not present

## 2017-12-29 DIAGNOSIS — E1142 Type 2 diabetes mellitus with diabetic polyneuropathy: Secondary | ICD-10-CM | POA: Diagnosis not present

## 2017-12-29 DIAGNOSIS — S31829D Unspecified open wound of left buttock, subsequent encounter: Secondary | ICD-10-CM | POA: Diagnosis not present

## 2017-12-29 DIAGNOSIS — E1122 Type 2 diabetes mellitus with diabetic chronic kidney disease: Secondary | ICD-10-CM | POA: Diagnosis not present

## 2017-12-29 DIAGNOSIS — G309 Alzheimer's disease, unspecified: Secondary | ICD-10-CM | POA: Diagnosis not present

## 2017-12-29 DIAGNOSIS — I129 Hypertensive chronic kidney disease with stage 1 through stage 4 chronic kidney disease, or unspecified chronic kidney disease: Secondary | ICD-10-CM | POA: Diagnosis not present

## 2017-12-31 DIAGNOSIS — E1122 Type 2 diabetes mellitus with diabetic chronic kidney disease: Secondary | ICD-10-CM | POA: Diagnosis not present

## 2017-12-31 DIAGNOSIS — F0280 Dementia in other diseases classified elsewhere without behavioral disturbance: Secondary | ICD-10-CM | POA: Diagnosis not present

## 2017-12-31 DIAGNOSIS — S31829D Unspecified open wound of left buttock, subsequent encounter: Secondary | ICD-10-CM | POA: Diagnosis not present

## 2017-12-31 DIAGNOSIS — I129 Hypertensive chronic kidney disease with stage 1 through stage 4 chronic kidney disease, or unspecified chronic kidney disease: Secondary | ICD-10-CM | POA: Diagnosis not present

## 2017-12-31 DIAGNOSIS — G309 Alzheimer's disease, unspecified: Secondary | ICD-10-CM | POA: Diagnosis not present

## 2017-12-31 DIAGNOSIS — E1142 Type 2 diabetes mellitus with diabetic polyneuropathy: Secondary | ICD-10-CM | POA: Diagnosis not present

## 2018-01-02 DIAGNOSIS — S31829D Unspecified open wound of left buttock, subsequent encounter: Secondary | ICD-10-CM | POA: Diagnosis not present

## 2018-01-02 DIAGNOSIS — F0280 Dementia in other diseases classified elsewhere without behavioral disturbance: Secondary | ICD-10-CM | POA: Diagnosis not present

## 2018-01-02 DIAGNOSIS — I129 Hypertensive chronic kidney disease with stage 1 through stage 4 chronic kidney disease, or unspecified chronic kidney disease: Secondary | ICD-10-CM | POA: Diagnosis not present

## 2018-01-02 DIAGNOSIS — E1122 Type 2 diabetes mellitus with diabetic chronic kidney disease: Secondary | ICD-10-CM | POA: Diagnosis not present

## 2018-01-02 DIAGNOSIS — E1142 Type 2 diabetes mellitus with diabetic polyneuropathy: Secondary | ICD-10-CM | POA: Diagnosis not present

## 2018-01-02 DIAGNOSIS — G309 Alzheimer's disease, unspecified: Secondary | ICD-10-CM | POA: Diagnosis not present

## 2018-01-05 DIAGNOSIS — I129 Hypertensive chronic kidney disease with stage 1 through stage 4 chronic kidney disease, or unspecified chronic kidney disease: Secondary | ICD-10-CM | POA: Diagnosis not present

## 2018-01-05 DIAGNOSIS — E1142 Type 2 diabetes mellitus with diabetic polyneuropathy: Secondary | ICD-10-CM | POA: Diagnosis not present

## 2018-01-05 DIAGNOSIS — F0280 Dementia in other diseases classified elsewhere without behavioral disturbance: Secondary | ICD-10-CM | POA: Diagnosis not present

## 2018-01-05 DIAGNOSIS — E1122 Type 2 diabetes mellitus with diabetic chronic kidney disease: Secondary | ICD-10-CM | POA: Diagnosis not present

## 2018-01-05 DIAGNOSIS — G309 Alzheimer's disease, unspecified: Secondary | ICD-10-CM | POA: Diagnosis not present

## 2018-01-05 DIAGNOSIS — S31829D Unspecified open wound of left buttock, subsequent encounter: Secondary | ICD-10-CM | POA: Diagnosis not present

## 2018-01-07 DIAGNOSIS — S31829D Unspecified open wound of left buttock, subsequent encounter: Secondary | ICD-10-CM | POA: Diagnosis not present

## 2018-01-07 DIAGNOSIS — E1142 Type 2 diabetes mellitus with diabetic polyneuropathy: Secondary | ICD-10-CM | POA: Diagnosis not present

## 2018-01-07 DIAGNOSIS — E1122 Type 2 diabetes mellitus with diabetic chronic kidney disease: Secondary | ICD-10-CM | POA: Diagnosis not present

## 2018-01-07 DIAGNOSIS — F0280 Dementia in other diseases classified elsewhere without behavioral disturbance: Secondary | ICD-10-CM | POA: Diagnosis not present

## 2018-01-07 DIAGNOSIS — I129 Hypertensive chronic kidney disease with stage 1 through stage 4 chronic kidney disease, or unspecified chronic kidney disease: Secondary | ICD-10-CM | POA: Diagnosis not present

## 2018-01-07 DIAGNOSIS — G309 Alzheimer's disease, unspecified: Secondary | ICD-10-CM | POA: Diagnosis not present

## 2018-01-08 ENCOUNTER — Encounter: Payer: Self-pay | Admitting: Podiatry

## 2018-01-08 ENCOUNTER — Ambulatory Visit (INDEPENDENT_AMBULATORY_CARE_PROVIDER_SITE_OTHER): Payer: Medicare Other | Admitting: Podiatry

## 2018-01-08 DIAGNOSIS — B351 Tinea unguium: Secondary | ICD-10-CM | POA: Diagnosis not present

## 2018-01-08 DIAGNOSIS — E0842 Diabetes mellitus due to underlying condition with diabetic polyneuropathy: Secondary | ICD-10-CM

## 2018-01-08 DIAGNOSIS — M79676 Pain in unspecified toe(s): Secondary | ICD-10-CM | POA: Diagnosis not present

## 2018-01-11 NOTE — Progress Notes (Signed)
   SUBJECTIVE Patient with a history of diabetes mellitus presents to office today complaining of elongated, thickened nails that cause pain while ambulating in shoes. She is unable to trim her own nails. Patient is here for further evaluation and treatment.   Past Medical History:  Diagnosis Date  . Cystocele   . Dementia ([place])   . Depressed   . Diabetes mellitus   . Endometriosis   . Hypertension   . Rectocele   . Thyroid disease   . Urinary incontinence    USI    OBJECTIVE General Patient is awake, alert, and oriented x 3 and in no acute distress. Derm Skin is dry and supple bilateral. Negative open lesions or macerations. Remaining integument unremarkable. Nails are tender, long, thickened and dystrophic with subungual debris, consistent with onychomycosis, 1-5 bilateral. No signs of infection noted. Vasc  DP and PT pedal pulses palpable bilaterally. Temperature gradient within normal limits.  Neuro Epicritic and protective threshold sensation diminished bilaterally.  Musculoskeletal Exam No symptomatic pedal deformities noted bilateral. Muscular strength within normal limits.  ASSESSMENT 1. Diabetes Mellitus w/ peripheral neuropathy 2. Onychomycosis of nail due to dermatophyte bilateral 3. Pain in foot bilateral  PLAN OF CARE 1. Patient evaluated today. 2. Instructed to maintain good pedal hygiene and foot care. Stressed importance of controlling blood sugar.  3. Mechanical debridement of nails 1-5 bilaterally performed using a nail nipper. Filed with dremel without incident.  4. Return to clinic in 3 mos.     Edrick Kins, DPM [facility]  Dr. Edrick Kins, [address]                                        [place], [place] [address]                Office [phone]  Fax [phone]

## 2018-03-02 DIAGNOSIS — L281 Prurigo nodularis: Secondary | ICD-10-CM | POA: Diagnosis not present

## 2018-03-02 DIAGNOSIS — D225 Melanocytic nevi of trunk: Secondary | ICD-10-CM | POA: Diagnosis not present

## 2018-03-02 DIAGNOSIS — D485 Neoplasm of uncertain behavior of skin: Secondary | ICD-10-CM | POA: Diagnosis not present

## 2018-03-02 DIAGNOSIS — L905 Scar conditions and fibrosis of skin: Secondary | ICD-10-CM | POA: Diagnosis not present

## 2018-03-02 DIAGNOSIS — L853 Xerosis cutis: Secondary | ICD-10-CM | POA: Diagnosis not present

## 2018-03-02 DIAGNOSIS — L812 Freckles: Secondary | ICD-10-CM | POA: Diagnosis not present

## 2018-03-02 DIAGNOSIS — Z1283 Encounter for screening for malignant neoplasm of skin: Secondary | ICD-10-CM | POA: Diagnosis not present

## 2018-03-02 DIAGNOSIS — D1800 Hemangioma unspecified site: Secondary | ICD-10-CM | POA: Diagnosis not present

## 2018-03-02 DIAGNOSIS — L821 Other seborrheic keratosis: Secondary | ICD-10-CM | POA: Diagnosis not present

## 2018-04-09 ENCOUNTER — Ambulatory Visit: Payer: Medicare Other | Admitting: Podiatry

## 2018-04-13 ENCOUNTER — Ambulatory Visit: Payer: Medicare Other | Admitting: Podiatry

## 2018-04-23 DIAGNOSIS — I129 Hypertensive chronic kidney disease with stage 1 through stage 4 chronic kidney disease, or unspecified chronic kidney disease: Secondary | ICD-10-CM | POA: Diagnosis not present

## 2018-04-23 DIAGNOSIS — G309 Alzheimer's disease, unspecified: Secondary | ICD-10-CM | POA: Diagnosis not present

## 2018-04-23 DIAGNOSIS — Z7984 Long term (current) use of oral hypoglycemic drugs: Secondary | ICD-10-CM | POA: Diagnosis not present

## 2018-04-23 DIAGNOSIS — E1121 Type 2 diabetes mellitus with diabetic nephropathy: Secondary | ICD-10-CM | POA: Diagnosis not present

## 2018-04-23 DIAGNOSIS — N183 Chronic kidney disease, stage 3 (moderate): Secondary | ICD-10-CM | POA: Diagnosis not present

## 2018-04-23 DIAGNOSIS — E1142 Type 2 diabetes mellitus with diabetic polyneuropathy: Secondary | ICD-10-CM | POA: Diagnosis not present

## 2018-05-18 ENCOUNTER — Ambulatory Visit: Payer: Medicare Other | Admitting: Podiatry

## 2018-06-11 DIAGNOSIS — Z20828 Contact with and (suspected) exposure to other viral communicable diseases: Secondary | ICD-10-CM | POA: Diagnosis not present

## 2018-07-06 ENCOUNTER — Ambulatory Visit: Payer: Medicare Other | Admitting: Podiatry

## 2018-07-06 DIAGNOSIS — E119 Type 2 diabetes mellitus without complications: Secondary | ICD-10-CM | POA: Diagnosis not present

## 2018-07-06 DIAGNOSIS — E559 Vitamin D deficiency, unspecified: Secondary | ICD-10-CM | POA: Diagnosis not present

## 2018-07-06 DIAGNOSIS — Z79899 Other long term (current) drug therapy: Secondary | ICD-10-CM | POA: Diagnosis not present

## 2018-07-06 DIAGNOSIS — E039 Hypothyroidism, unspecified: Secondary | ICD-10-CM | POA: Diagnosis not present

## 2018-07-06 DIAGNOSIS — E7849 Other hyperlipidemia: Secondary | ICD-10-CM | POA: Diagnosis not present

## 2018-07-15 DIAGNOSIS — E119 Type 2 diabetes mellitus without complications: Secondary | ICD-10-CM | POA: Diagnosis not present

## 2018-07-15 DIAGNOSIS — D7589 Other specified diseases of blood and blood-forming organs: Secondary | ICD-10-CM | POA: Diagnosis not present

## 2018-07-15 DIAGNOSIS — E039 Hypothyroidism, unspecified: Secondary | ICD-10-CM | POA: Diagnosis not present

## 2018-07-15 DIAGNOSIS — R635 Abnormal weight gain: Secondary | ICD-10-CM | POA: Diagnosis not present

## 2018-07-16 DIAGNOSIS — E039 Hypothyroidism, unspecified: Secondary | ICD-10-CM | POA: Diagnosis not present

## 2018-07-16 DIAGNOSIS — D7589 Other specified diseases of blood and blood-forming organs: Secondary | ICD-10-CM | POA: Diagnosis not present

## 2018-07-16 DIAGNOSIS — R635 Abnormal weight gain: Secondary | ICD-10-CM | POA: Diagnosis not present

## 2018-07-16 DIAGNOSIS — E119 Type 2 diabetes mellitus without complications: Secondary | ICD-10-CM | POA: Diagnosis not present

## 2018-08-03 DIAGNOSIS — S31801A Laceration without foreign body of unspecified buttock, initial encounter: Secondary | ICD-10-CM | POA: Diagnosis not present

## 2018-08-03 DIAGNOSIS — E119 Type 2 diabetes mellitus without complications: Secondary | ICD-10-CM | POA: Diagnosis not present

## 2018-08-03 DIAGNOSIS — G309 Alzheimer's disease, unspecified: Secondary | ICD-10-CM | POA: Diagnosis not present

## 2018-08-03 DIAGNOSIS — E7849 Other hyperlipidemia: Secondary | ICD-10-CM | POA: Diagnosis not present

## 2018-08-07 DIAGNOSIS — E119 Type 2 diabetes mellitus without complications: Secondary | ICD-10-CM | POA: Diagnosis not present

## 2018-08-07 DIAGNOSIS — E785 Hyperlipidemia, unspecified: Secondary | ICD-10-CM | POA: Diagnosis not present

## 2018-08-07 DIAGNOSIS — I10 Essential (primary) hypertension: Secondary | ICD-10-CM | POA: Diagnosis not present

## 2018-08-07 DIAGNOSIS — F329 Major depressive disorder, single episode, unspecified: Secondary | ICD-10-CM | POA: Diagnosis not present

## 2018-08-07 DIAGNOSIS — G309 Alzheimer's disease, unspecified: Secondary | ICD-10-CM | POA: Diagnosis not present

## 2018-08-07 DIAGNOSIS — S31829D Unspecified open wound of left buttock, subsequent encounter: Secondary | ICD-10-CM | POA: Diagnosis not present

## 2018-08-07 DIAGNOSIS — Z7984 Long term (current) use of oral hypoglycemic drugs: Secondary | ICD-10-CM | POA: Diagnosis not present

## 2018-08-07 DIAGNOSIS — F0280 Dementia in other diseases classified elsewhere without behavioral disturbance: Secondary | ICD-10-CM | POA: Diagnosis not present

## 2018-08-07 DIAGNOSIS — E039 Hypothyroidism, unspecified: Secondary | ICD-10-CM | POA: Diagnosis not present

## 2018-08-11 DIAGNOSIS — E119 Type 2 diabetes mellitus without complications: Secondary | ICD-10-CM | POA: Diagnosis not present

## 2018-08-11 DIAGNOSIS — F0280 Dementia in other diseases classified elsewhere without behavioral disturbance: Secondary | ICD-10-CM | POA: Diagnosis not present

## 2018-08-11 DIAGNOSIS — F329 Major depressive disorder, single episode, unspecified: Secondary | ICD-10-CM | POA: Diagnosis not present

## 2018-08-11 DIAGNOSIS — S31829D Unspecified open wound of left buttock, subsequent encounter: Secondary | ICD-10-CM | POA: Diagnosis not present

## 2018-08-11 DIAGNOSIS — I10 Essential (primary) hypertension: Secondary | ICD-10-CM | POA: Diagnosis not present

## 2018-08-11 DIAGNOSIS — G309 Alzheimer's disease, unspecified: Secondary | ICD-10-CM | POA: Diagnosis not present

## 2018-08-12 DIAGNOSIS — I10 Essential (primary) hypertension: Secondary | ICD-10-CM | POA: Diagnosis not present

## 2018-08-12 DIAGNOSIS — F329 Major depressive disorder, single episode, unspecified: Secondary | ICD-10-CM | POA: Diagnosis not present

## 2018-08-12 DIAGNOSIS — S31829D Unspecified open wound of left buttock, subsequent encounter: Secondary | ICD-10-CM | POA: Diagnosis not present

## 2018-08-12 DIAGNOSIS — E119 Type 2 diabetes mellitus without complications: Secondary | ICD-10-CM | POA: Diagnosis not present

## 2018-08-12 DIAGNOSIS — F0280 Dementia in other diseases classified elsewhere without behavioral disturbance: Secondary | ICD-10-CM | POA: Diagnosis not present

## 2018-08-12 DIAGNOSIS — G309 Alzheimer's disease, unspecified: Secondary | ICD-10-CM | POA: Diagnosis not present

## 2018-08-13 DIAGNOSIS — I10 Essential (primary) hypertension: Secondary | ICD-10-CM | POA: Diagnosis not present

## 2018-08-13 DIAGNOSIS — G309 Alzheimer's disease, unspecified: Secondary | ICD-10-CM | POA: Diagnosis not present

## 2018-08-13 DIAGNOSIS — E119 Type 2 diabetes mellitus without complications: Secondary | ICD-10-CM | POA: Diagnosis not present

## 2018-08-13 DIAGNOSIS — S31829D Unspecified open wound of left buttock, subsequent encounter: Secondary | ICD-10-CM | POA: Diagnosis not present

## 2018-08-13 DIAGNOSIS — F0280 Dementia in other diseases classified elsewhere without behavioral disturbance: Secondary | ICD-10-CM | POA: Diagnosis not present

## 2018-08-13 DIAGNOSIS — F329 Major depressive disorder, single episode, unspecified: Secondary | ICD-10-CM | POA: Diagnosis not present

## 2018-08-17 DIAGNOSIS — R634 Abnormal weight loss: Secondary | ICD-10-CM | POA: Diagnosis not present

## 2018-08-17 DIAGNOSIS — I10 Essential (primary) hypertension: Secondary | ICD-10-CM | POA: Diagnosis not present

## 2018-08-17 DIAGNOSIS — G308 Other Alzheimer's disease: Secondary | ICD-10-CM | POA: Diagnosis not present

## 2018-08-17 DIAGNOSIS — L89152 Pressure ulcer of sacral region, stage 2: Secondary | ICD-10-CM | POA: Diagnosis not present

## 2018-08-18 DIAGNOSIS — G309 Alzheimer's disease, unspecified: Secondary | ICD-10-CM | POA: Diagnosis not present

## 2018-08-18 DIAGNOSIS — I10 Essential (primary) hypertension: Secondary | ICD-10-CM | POA: Diagnosis not present

## 2018-08-18 DIAGNOSIS — S31829D Unspecified open wound of left buttock, subsequent encounter: Secondary | ICD-10-CM | POA: Diagnosis not present

## 2018-08-18 DIAGNOSIS — E119 Type 2 diabetes mellitus without complications: Secondary | ICD-10-CM | POA: Diagnosis not present

## 2018-08-18 DIAGNOSIS — F329 Major depressive disorder, single episode, unspecified: Secondary | ICD-10-CM | POA: Diagnosis not present

## 2018-08-18 DIAGNOSIS — F0280 Dementia in other diseases classified elsewhere without behavioral disturbance: Secondary | ICD-10-CM | POA: Diagnosis not present

## 2018-08-20 DIAGNOSIS — F329 Major depressive disorder, single episode, unspecified: Secondary | ICD-10-CM | POA: Diagnosis not present

## 2018-08-20 DIAGNOSIS — S31829D Unspecified open wound of left buttock, subsequent encounter: Secondary | ICD-10-CM | POA: Diagnosis not present

## 2018-08-20 DIAGNOSIS — F0280 Dementia in other diseases classified elsewhere without behavioral disturbance: Secondary | ICD-10-CM | POA: Diagnosis not present

## 2018-08-20 DIAGNOSIS — G309 Alzheimer's disease, unspecified: Secondary | ICD-10-CM | POA: Diagnosis not present

## 2018-08-20 DIAGNOSIS — E119 Type 2 diabetes mellitus without complications: Secondary | ICD-10-CM | POA: Diagnosis not present

## 2018-08-20 DIAGNOSIS — I10 Essential (primary) hypertension: Secondary | ICD-10-CM | POA: Diagnosis not present

## 2018-08-23 DIAGNOSIS — G309 Alzheimer's disease, unspecified: Secondary | ICD-10-CM | POA: Diagnosis not present

## 2018-08-23 DIAGNOSIS — F0280 Dementia in other diseases classified elsewhere without behavioral disturbance: Secondary | ICD-10-CM | POA: Diagnosis not present

## 2018-08-23 DIAGNOSIS — F329 Major depressive disorder, single episode, unspecified: Secondary | ICD-10-CM | POA: Diagnosis not present

## 2018-08-23 DIAGNOSIS — E119 Type 2 diabetes mellitus without complications: Secondary | ICD-10-CM | POA: Diagnosis not present

## 2018-08-23 DIAGNOSIS — I10 Essential (primary) hypertension: Secondary | ICD-10-CM | POA: Diagnosis not present

## 2018-08-23 DIAGNOSIS — S31829D Unspecified open wound of left buttock, subsequent encounter: Secondary | ICD-10-CM | POA: Diagnosis not present

## 2018-08-25 DIAGNOSIS — F0280 Dementia in other diseases classified elsewhere without behavioral disturbance: Secondary | ICD-10-CM | POA: Diagnosis not present

## 2018-08-25 DIAGNOSIS — I10 Essential (primary) hypertension: Secondary | ICD-10-CM | POA: Diagnosis not present

## 2018-08-25 DIAGNOSIS — G309 Alzheimer's disease, unspecified: Secondary | ICD-10-CM | POA: Diagnosis not present

## 2018-08-25 DIAGNOSIS — F329 Major depressive disorder, single episode, unspecified: Secondary | ICD-10-CM | POA: Diagnosis not present

## 2018-08-25 DIAGNOSIS — E119 Type 2 diabetes mellitus without complications: Secondary | ICD-10-CM | POA: Diagnosis not present

## 2018-08-25 DIAGNOSIS — S31829D Unspecified open wound of left buttock, subsequent encounter: Secondary | ICD-10-CM | POA: Diagnosis not present

## 2018-08-26 DIAGNOSIS — E119 Type 2 diabetes mellitus without complications: Secondary | ICD-10-CM | POA: Diagnosis not present

## 2018-08-26 DIAGNOSIS — F329 Major depressive disorder, single episode, unspecified: Secondary | ICD-10-CM | POA: Diagnosis not present

## 2018-08-26 DIAGNOSIS — G309 Alzheimer's disease, unspecified: Secondary | ICD-10-CM | POA: Diagnosis not present

## 2018-08-26 DIAGNOSIS — F0280 Dementia in other diseases classified elsewhere without behavioral disturbance: Secondary | ICD-10-CM | POA: Diagnosis not present

## 2018-08-26 DIAGNOSIS — I10 Essential (primary) hypertension: Secondary | ICD-10-CM | POA: Diagnosis not present

## 2018-08-26 DIAGNOSIS — S31829D Unspecified open wound of left buttock, subsequent encounter: Secondary | ICD-10-CM | POA: Diagnosis not present

## 2018-08-31 DIAGNOSIS — I10 Essential (primary) hypertension: Secondary | ICD-10-CM | POA: Diagnosis not present

## 2018-08-31 DIAGNOSIS — F329 Major depressive disorder, single episode, unspecified: Secondary | ICD-10-CM | POA: Diagnosis not present

## 2018-08-31 DIAGNOSIS — G309 Alzheimer's disease, unspecified: Secondary | ICD-10-CM | POA: Diagnosis not present

## 2018-08-31 DIAGNOSIS — F0280 Dementia in other diseases classified elsewhere without behavioral disturbance: Secondary | ICD-10-CM | POA: Diagnosis not present

## 2018-08-31 DIAGNOSIS — S31829D Unspecified open wound of left buttock, subsequent encounter: Secondary | ICD-10-CM | POA: Diagnosis not present

## 2018-08-31 DIAGNOSIS — E119 Type 2 diabetes mellitus without complications: Secondary | ICD-10-CM | POA: Diagnosis not present

## 2018-09-03 DIAGNOSIS — I10 Essential (primary) hypertension: Secondary | ICD-10-CM | POA: Diagnosis not present

## 2018-09-03 DIAGNOSIS — E119 Type 2 diabetes mellitus without complications: Secondary | ICD-10-CM | POA: Diagnosis not present

## 2018-09-03 DIAGNOSIS — F329 Major depressive disorder, single episode, unspecified: Secondary | ICD-10-CM | POA: Diagnosis not present

## 2018-09-03 DIAGNOSIS — S31829D Unspecified open wound of left buttock, subsequent encounter: Secondary | ICD-10-CM | POA: Diagnosis not present

## 2018-09-03 DIAGNOSIS — F0280 Dementia in other diseases classified elsewhere without behavioral disturbance: Secondary | ICD-10-CM | POA: Diagnosis not present

## 2018-09-03 DIAGNOSIS — G309 Alzheimer's disease, unspecified: Secondary | ICD-10-CM | POA: Diagnosis not present

## 2018-09-06 DIAGNOSIS — Z7984 Long term (current) use of oral hypoglycemic drugs: Secondary | ICD-10-CM | POA: Diagnosis not present

## 2018-09-06 DIAGNOSIS — F0280 Dementia in other diseases classified elsewhere without behavioral disturbance: Secondary | ICD-10-CM | POA: Diagnosis not present

## 2018-09-06 DIAGNOSIS — E119 Type 2 diabetes mellitus without complications: Secondary | ICD-10-CM | POA: Diagnosis not present

## 2018-09-06 DIAGNOSIS — E785 Hyperlipidemia, unspecified: Secondary | ICD-10-CM | POA: Diagnosis not present

## 2018-09-06 DIAGNOSIS — G309 Alzheimer's disease, unspecified: Secondary | ICD-10-CM | POA: Diagnosis not present

## 2018-09-06 DIAGNOSIS — I10 Essential (primary) hypertension: Secondary | ICD-10-CM | POA: Diagnosis not present

## 2018-09-06 DIAGNOSIS — S31829D Unspecified open wound of left buttock, subsequent encounter: Secondary | ICD-10-CM | POA: Diagnosis not present

## 2018-09-06 DIAGNOSIS — E039 Hypothyroidism, unspecified: Secondary | ICD-10-CM | POA: Diagnosis not present

## 2018-09-06 DIAGNOSIS — F329 Major depressive disorder, single episode, unspecified: Secondary | ICD-10-CM | POA: Diagnosis not present

## 2018-09-21 DIAGNOSIS — F0280 Dementia in other diseases classified elsewhere without behavioral disturbance: Secondary | ICD-10-CM | POA: Diagnosis not present

## 2018-09-21 DIAGNOSIS — G309 Alzheimer's disease, unspecified: Secondary | ICD-10-CM | POA: Diagnosis not present

## 2018-09-21 DIAGNOSIS — I10 Essential (primary) hypertension: Secondary | ICD-10-CM | POA: Diagnosis not present

## 2018-09-21 DIAGNOSIS — R635 Abnormal weight gain: Secondary | ICD-10-CM | POA: Diagnosis not present

## 2018-09-21 DIAGNOSIS — E119 Type 2 diabetes mellitus without complications: Secondary | ICD-10-CM | POA: Diagnosis not present

## 2018-09-21 DIAGNOSIS — F329 Major depressive disorder, single episode, unspecified: Secondary | ICD-10-CM | POA: Diagnosis not present

## 2018-09-21 DIAGNOSIS — S31829D Unspecified open wound of left buttock, subsequent encounter: Secondary | ICD-10-CM | POA: Diagnosis not present

## 2018-09-21 DIAGNOSIS — R197 Diarrhea, unspecified: Secondary | ICD-10-CM | POA: Diagnosis not present

## 2018-09-21 DIAGNOSIS — E039 Hypothyroidism, unspecified: Secondary | ICD-10-CM | POA: Diagnosis not present

## 2018-09-27 DIAGNOSIS — F329 Major depressive disorder, single episode, unspecified: Secondary | ICD-10-CM | POA: Diagnosis not present

## 2018-09-27 DIAGNOSIS — I10 Essential (primary) hypertension: Secondary | ICD-10-CM | POA: Diagnosis not present

## 2018-09-27 DIAGNOSIS — E119 Type 2 diabetes mellitus without complications: Secondary | ICD-10-CM | POA: Diagnosis not present

## 2018-09-27 DIAGNOSIS — S31829D Unspecified open wound of left buttock, subsequent encounter: Secondary | ICD-10-CM | POA: Diagnosis not present

## 2018-09-27 DIAGNOSIS — F0280 Dementia in other diseases classified elsewhere without behavioral disturbance: Secondary | ICD-10-CM | POA: Diagnosis not present

## 2018-09-27 DIAGNOSIS — G309 Alzheimer's disease, unspecified: Secondary | ICD-10-CM | POA: Diagnosis not present

## 2018-09-28 DIAGNOSIS — E559 Vitamin D deficiency, unspecified: Secondary | ICD-10-CM | POA: Diagnosis not present

## 2018-09-28 DIAGNOSIS — E119 Type 2 diabetes mellitus without complications: Secondary | ICD-10-CM | POA: Diagnosis not present

## 2018-09-28 DIAGNOSIS — E7849 Other hyperlipidemia: Secondary | ICD-10-CM | POA: Diagnosis not present

## 2018-09-28 DIAGNOSIS — R197 Diarrhea, unspecified: Secondary | ICD-10-CM | POA: Diagnosis not present

## 2018-11-02 DIAGNOSIS — Z23 Encounter for immunization: Secondary | ICD-10-CM | POA: Diagnosis not present

## 2018-11-07 DIAGNOSIS — M21622 Bunionette of left foot: Secondary | ICD-10-CM | POA: Diagnosis not present

## 2018-11-07 DIAGNOSIS — M21621 Bunionette of right foot: Secondary | ICD-10-CM | POA: Diagnosis not present

## 2018-11-07 DIAGNOSIS — E1151 Type 2 diabetes mellitus with diabetic peripheral angiopathy without gangrene: Secondary | ICD-10-CM | POA: Diagnosis not present

## 2018-11-07 DIAGNOSIS — L602 Onychogryphosis: Secondary | ICD-10-CM | POA: Diagnosis not present

## 2018-11-12 DIAGNOSIS — E119 Type 2 diabetes mellitus without complications: Secondary | ICD-10-CM | POA: Diagnosis not present

## 2018-11-12 DIAGNOSIS — I10 Essential (primary) hypertension: Secondary | ICD-10-CM | POA: Diagnosis not present

## 2018-11-12 DIAGNOSIS — Z79899 Other long term (current) drug therapy: Secondary | ICD-10-CM | POA: Diagnosis not present

## 2018-11-13 DIAGNOSIS — Z79899 Other long term (current) drug therapy: Secondary | ICD-10-CM | POA: Diagnosis not present

## 2018-11-13 DIAGNOSIS — E119 Type 2 diabetes mellitus without complications: Secondary | ICD-10-CM | POA: Diagnosis not present

## 2018-11-13 DIAGNOSIS — I10 Essential (primary) hypertension: Secondary | ICD-10-CM | POA: Diagnosis not present

## 2018-11-18 DIAGNOSIS — Z20828 Contact with and (suspected) exposure to other viral communicable diseases: Secondary | ICD-10-CM | POA: Diagnosis not present

## 2018-11-22 DIAGNOSIS — R634 Abnormal weight loss: Secondary | ICD-10-CM | POA: Diagnosis not present

## 2018-11-22 DIAGNOSIS — D7589 Other specified diseases of blood and blood-forming organs: Secondary | ICD-10-CM | POA: Diagnosis not present

## 2018-11-22 DIAGNOSIS — E875 Hyperkalemia: Secondary | ICD-10-CM | POA: Diagnosis not present

## 2018-11-22 DIAGNOSIS — N179 Acute kidney failure, unspecified: Secondary | ICD-10-CM | POA: Diagnosis not present

## 2018-12-06 DIAGNOSIS — L539 Erythematous condition, unspecified: Secondary | ICD-10-CM | POA: Diagnosis not present

## 2018-12-06 DIAGNOSIS — R000 Tachycardia, unspecified: Secondary | ICD-10-CM | POA: Diagnosis not present

## 2018-12-06 DIAGNOSIS — E119 Type 2 diabetes mellitus without complications: Secondary | ICD-10-CM | POA: Diagnosis not present

## 2018-12-06 DIAGNOSIS — R2681 Unsteadiness on feet: Secondary | ICD-10-CM | POA: Diagnosis not present

## 2018-12-07 DIAGNOSIS — R269 Unspecified abnormalities of gait and mobility: Secondary | ICD-10-CM | POA: Diagnosis not present

## 2018-12-07 DIAGNOSIS — L98429 Non-pressure chronic ulcer of back with unspecified severity: Secondary | ICD-10-CM | POA: Diagnosis not present

## 2018-12-07 DIAGNOSIS — E119 Type 2 diabetes mellitus without complications: Secondary | ICD-10-CM | POA: Diagnosis not present

## 2018-12-07 DIAGNOSIS — R000 Tachycardia, unspecified: Secondary | ICD-10-CM | POA: Diagnosis not present

## 2018-12-24 DIAGNOSIS — N179 Acute kidney failure, unspecified: Secondary | ICD-10-CM | POA: Diagnosis not present

## 2018-12-24 DIAGNOSIS — E875 Hyperkalemia: Secondary | ICD-10-CM | POA: Diagnosis not present

## 2018-12-29 DIAGNOSIS — E875 Hyperkalemia: Secondary | ICD-10-CM | POA: Diagnosis not present

## 2018-12-29 DIAGNOSIS — E119 Type 2 diabetes mellitus without complications: Secondary | ICD-10-CM | POA: Diagnosis not present

## 2018-12-29 DIAGNOSIS — R634 Abnormal weight loss: Secondary | ICD-10-CM | POA: Diagnosis not present

## 2018-12-29 DIAGNOSIS — N179 Acute kidney failure, unspecified: Secondary | ICD-10-CM | POA: Diagnosis not present

## 2019-01-17 DIAGNOSIS — Z23 Encounter for immunization: Secondary | ICD-10-CM | POA: Diagnosis not present

## 2019-01-25 DIAGNOSIS — R635 Abnormal weight gain: Secondary | ICD-10-CM | POA: Diagnosis not present

## 2019-01-25 DIAGNOSIS — U071 COVID-19: Secondary | ICD-10-CM | POA: Diagnosis not present

## 2019-01-25 DIAGNOSIS — E7849 Other hyperlipidemia: Secondary | ICD-10-CM | POA: Diagnosis not present

## 2019-01-25 DIAGNOSIS — I10 Essential (primary) hypertension: Secondary | ICD-10-CM | POA: Diagnosis not present

## 2019-02-10 DIAGNOSIS — R634 Abnormal weight loss: Secondary | ICD-10-CM | POA: Diagnosis not present

## 2019-02-10 DIAGNOSIS — E039 Hypothyroidism, unspecified: Secondary | ICD-10-CM | POA: Diagnosis not present

## 2019-02-10 DIAGNOSIS — R197 Diarrhea, unspecified: Secondary | ICD-10-CM | POA: Diagnosis not present

## 2019-02-14 DIAGNOSIS — F0390 Unspecified dementia without behavioral disturbance: Secondary | ICD-10-CM | POA: Diagnosis not present

## 2019-02-14 DIAGNOSIS — Z23 Encounter for immunization: Secondary | ICD-10-CM | POA: Diagnosis not present

## 2019-02-14 DIAGNOSIS — E559 Vitamin D deficiency, unspecified: Secondary | ICD-10-CM | POA: Diagnosis not present

## 2019-02-14 DIAGNOSIS — R197 Diarrhea, unspecified: Secondary | ICD-10-CM | POA: Diagnosis not present

## 2019-02-21 DIAGNOSIS — L84 Corns and callosities: Secondary | ICD-10-CM | POA: Diagnosis not present

## 2019-02-21 DIAGNOSIS — M21621 Bunionette of right foot: Secondary | ICD-10-CM | POA: Diagnosis not present

## 2019-02-21 DIAGNOSIS — M21622 Bunionette of left foot: Secondary | ICD-10-CM | POA: Diagnosis not present

## 2019-02-21 DIAGNOSIS — E1151 Type 2 diabetes mellitus with diabetic peripheral angiopathy without gangrene: Secondary | ICD-10-CM | POA: Diagnosis not present

## 2019-02-21 DIAGNOSIS — L602 Onychogryphosis: Secondary | ICD-10-CM | POA: Diagnosis not present

## 2019-02-24 DIAGNOSIS — L814 Other melanin hyperpigmentation: Secondary | ICD-10-CM | POA: Diagnosis not present

## 2019-02-24 DIAGNOSIS — S0001XA Abrasion of scalp, initial encounter: Secondary | ICD-10-CM | POA: Diagnosis not present

## 2019-02-24 DIAGNOSIS — L821 Other seborrheic keratosis: Secondary | ICD-10-CM | POA: Diagnosis not present

## 2019-02-24 DIAGNOSIS — Z85828 Personal history of other malignant neoplasm of skin: Secondary | ICD-10-CM | POA: Diagnosis not present

## 2019-03-08 DIAGNOSIS — I10 Essential (primary) hypertension: Secondary | ICD-10-CM | POA: Diagnosis not present

## 2019-03-08 DIAGNOSIS — L89152 Pressure ulcer of sacral region, stage 2: Secondary | ICD-10-CM | POA: Diagnosis not present

## 2019-03-08 DIAGNOSIS — E7849 Other hyperlipidemia: Secondary | ICD-10-CM | POA: Diagnosis not present

## 2019-03-08 DIAGNOSIS — E119 Type 2 diabetes mellitus without complications: Secondary | ICD-10-CM | POA: Diagnosis not present

## 2019-03-11 DIAGNOSIS — F0280 Dementia in other diseases classified elsewhere without behavioral disturbance: Secondary | ICD-10-CM | POA: Diagnosis not present

## 2019-03-11 DIAGNOSIS — L89152 Pressure ulcer of sacral region, stage 2: Secondary | ICD-10-CM | POA: Diagnosis not present

## 2019-03-11 DIAGNOSIS — G309 Alzheimer's disease, unspecified: Secondary | ICD-10-CM | POA: Diagnosis not present

## 2019-03-11 DIAGNOSIS — E785 Hyperlipidemia, unspecified: Secondary | ICD-10-CM | POA: Diagnosis not present

## 2019-03-11 DIAGNOSIS — I10 Essential (primary) hypertension: Secondary | ICD-10-CM | POA: Diagnosis not present

## 2019-03-11 DIAGNOSIS — E119 Type 2 diabetes mellitus without complications: Secondary | ICD-10-CM | POA: Diagnosis not present

## 2019-03-11 DIAGNOSIS — E039 Hypothyroidism, unspecified: Secondary | ICD-10-CM | POA: Diagnosis not present

## 2019-03-11 DIAGNOSIS — F329 Major depressive disorder, single episode, unspecified: Secondary | ICD-10-CM | POA: Diagnosis not present

## 2019-03-11 DIAGNOSIS — Z7984 Long term (current) use of oral hypoglycemic drugs: Secondary | ICD-10-CM | POA: Diagnosis not present

## 2019-03-11 DIAGNOSIS — Z4800 Encounter for change or removal of nonsurgical wound dressing: Secondary | ICD-10-CM | POA: Diagnosis not present

## 2019-03-14 DIAGNOSIS — F0280 Dementia in other diseases classified elsewhere without behavioral disturbance: Secondary | ICD-10-CM | POA: Diagnosis not present

## 2019-03-14 DIAGNOSIS — L89152 Pressure ulcer of sacral region, stage 2: Secondary | ICD-10-CM | POA: Diagnosis not present

## 2019-03-14 DIAGNOSIS — G309 Alzheimer's disease, unspecified: Secondary | ICD-10-CM | POA: Diagnosis not present

## 2019-03-14 DIAGNOSIS — I10 Essential (primary) hypertension: Secondary | ICD-10-CM | POA: Diagnosis not present

## 2019-03-14 DIAGNOSIS — E119 Type 2 diabetes mellitus without complications: Secondary | ICD-10-CM | POA: Diagnosis not present

## 2019-03-14 DIAGNOSIS — F329 Major depressive disorder, single episode, unspecified: Secondary | ICD-10-CM | POA: Diagnosis not present

## 2019-03-16 DIAGNOSIS — F329 Major depressive disorder, single episode, unspecified: Secondary | ICD-10-CM | POA: Diagnosis not present

## 2019-03-16 DIAGNOSIS — G309 Alzheimer's disease, unspecified: Secondary | ICD-10-CM | POA: Diagnosis not present

## 2019-03-16 DIAGNOSIS — I10 Essential (primary) hypertension: Secondary | ICD-10-CM | POA: Diagnosis not present

## 2019-03-16 DIAGNOSIS — E119 Type 2 diabetes mellitus without complications: Secondary | ICD-10-CM | POA: Diagnosis not present

## 2019-03-16 DIAGNOSIS — F0280 Dementia in other diseases classified elsewhere without behavioral disturbance: Secondary | ICD-10-CM | POA: Diagnosis not present

## 2019-03-16 DIAGNOSIS — L89152 Pressure ulcer of sacral region, stage 2: Secondary | ICD-10-CM | POA: Diagnosis not present

## 2019-03-18 DIAGNOSIS — I10 Essential (primary) hypertension: Secondary | ICD-10-CM | POA: Diagnosis not present

## 2019-03-18 DIAGNOSIS — E039 Hypothyroidism, unspecified: Secondary | ICD-10-CM | POA: Diagnosis not present

## 2019-03-18 DIAGNOSIS — Z79899 Other long term (current) drug therapy: Secondary | ICD-10-CM | POA: Diagnosis not present

## 2019-03-18 DIAGNOSIS — E7849 Other hyperlipidemia: Secondary | ICD-10-CM | POA: Diagnosis not present

## 2019-03-18 DIAGNOSIS — R634 Abnormal weight loss: Secondary | ICD-10-CM | POA: Diagnosis not present

## 2019-03-18 DIAGNOSIS — D7589 Other specified diseases of blood and blood-forming organs: Secondary | ICD-10-CM | POA: Diagnosis not present

## 2019-03-18 DIAGNOSIS — E559 Vitamin D deficiency, unspecified: Secondary | ICD-10-CM | POA: Diagnosis not present

## 2019-03-21 DIAGNOSIS — G309 Alzheimer's disease, unspecified: Secondary | ICD-10-CM | POA: Diagnosis not present

## 2019-03-21 DIAGNOSIS — I10 Essential (primary) hypertension: Secondary | ICD-10-CM | POA: Diagnosis not present

## 2019-03-21 DIAGNOSIS — E119 Type 2 diabetes mellitus without complications: Secondary | ICD-10-CM | POA: Diagnosis not present

## 2019-03-21 DIAGNOSIS — L89152 Pressure ulcer of sacral region, stage 2: Secondary | ICD-10-CM | POA: Diagnosis not present

## 2019-03-21 DIAGNOSIS — F0280 Dementia in other diseases classified elsewhere without behavioral disturbance: Secondary | ICD-10-CM | POA: Diagnosis not present

## 2019-03-21 DIAGNOSIS — F329 Major depressive disorder, single episode, unspecified: Secondary | ICD-10-CM | POA: Diagnosis not present

## 2019-03-24 DIAGNOSIS — D72824 Basophilia: Secondary | ICD-10-CM | POA: Diagnosis not present

## 2019-03-24 DIAGNOSIS — D7589 Other specified diseases of blood and blood-forming organs: Secondary | ICD-10-CM | POA: Diagnosis not present

## 2019-03-24 DIAGNOSIS — G309 Alzheimer's disease, unspecified: Secondary | ICD-10-CM | POA: Diagnosis not present

## 2019-03-24 DIAGNOSIS — R739 Hyperglycemia, unspecified: Secondary | ICD-10-CM | POA: Diagnosis not present

## 2019-03-24 DIAGNOSIS — N179 Acute kidney failure, unspecified: Secondary | ICD-10-CM | POA: Diagnosis not present

## 2019-03-24 DIAGNOSIS — E873 Alkalosis: Secondary | ICD-10-CM | POA: Diagnosis not present

## 2019-03-24 DIAGNOSIS — F329 Major depressive disorder, single episode, unspecified: Secondary | ICD-10-CM | POA: Diagnosis not present

## 2019-03-24 DIAGNOSIS — F0280 Dementia in other diseases classified elsewhere without behavioral disturbance: Secondary | ICD-10-CM | POA: Diagnosis not present

## 2019-03-24 DIAGNOSIS — I10 Essential (primary) hypertension: Secondary | ICD-10-CM | POA: Diagnosis not present

## 2019-03-24 DIAGNOSIS — L89152 Pressure ulcer of sacral region, stage 2: Secondary | ICD-10-CM | POA: Diagnosis not present

## 2019-03-24 DIAGNOSIS — E119 Type 2 diabetes mellitus without complications: Secondary | ICD-10-CM | POA: Diagnosis not present

## 2019-03-28 DIAGNOSIS — F0280 Dementia in other diseases classified elsewhere without behavioral disturbance: Secondary | ICD-10-CM | POA: Diagnosis not present

## 2019-03-28 DIAGNOSIS — F329 Major depressive disorder, single episode, unspecified: Secondary | ICD-10-CM | POA: Diagnosis not present

## 2019-03-28 DIAGNOSIS — E119 Type 2 diabetes mellitus without complications: Secondary | ICD-10-CM | POA: Diagnosis not present

## 2019-03-28 DIAGNOSIS — G309 Alzheimer's disease, unspecified: Secondary | ICD-10-CM | POA: Diagnosis not present

## 2019-03-28 DIAGNOSIS — I10 Essential (primary) hypertension: Secondary | ICD-10-CM | POA: Diagnosis not present

## 2019-03-28 DIAGNOSIS — L89152 Pressure ulcer of sacral region, stage 2: Secondary | ICD-10-CM | POA: Diagnosis not present

## 2019-03-31 DIAGNOSIS — F329 Major depressive disorder, single episode, unspecified: Secondary | ICD-10-CM | POA: Diagnosis not present

## 2019-03-31 DIAGNOSIS — G309 Alzheimer's disease, unspecified: Secondary | ICD-10-CM | POA: Diagnosis not present

## 2019-03-31 DIAGNOSIS — E119 Type 2 diabetes mellitus without complications: Secondary | ICD-10-CM | POA: Diagnosis not present

## 2019-03-31 DIAGNOSIS — I10 Essential (primary) hypertension: Secondary | ICD-10-CM | POA: Diagnosis not present

## 2019-03-31 DIAGNOSIS — L89152 Pressure ulcer of sacral region, stage 2: Secondary | ICD-10-CM | POA: Diagnosis not present

## 2019-03-31 DIAGNOSIS — F0280 Dementia in other diseases classified elsewhere without behavioral disturbance: Secondary | ICD-10-CM | POA: Diagnosis not present

## 2019-04-04 DIAGNOSIS — E119 Type 2 diabetes mellitus without complications: Secondary | ICD-10-CM | POA: Diagnosis not present

## 2019-04-04 DIAGNOSIS — L89152 Pressure ulcer of sacral region, stage 2: Secondary | ICD-10-CM | POA: Diagnosis not present

## 2019-04-04 DIAGNOSIS — I10 Essential (primary) hypertension: Secondary | ICD-10-CM | POA: Diagnosis not present

## 2019-04-04 DIAGNOSIS — F0280 Dementia in other diseases classified elsewhere without behavioral disturbance: Secondary | ICD-10-CM | POA: Diagnosis not present

## 2019-04-04 DIAGNOSIS — F329 Major depressive disorder, single episode, unspecified: Secondary | ICD-10-CM | POA: Diagnosis not present

## 2019-04-04 DIAGNOSIS — G309 Alzheimer's disease, unspecified: Secondary | ICD-10-CM | POA: Diagnosis not present

## 2019-04-05 DIAGNOSIS — H25013 Cortical age-related cataract, bilateral: Secondary | ICD-10-CM | POA: Diagnosis not present

## 2019-04-05 DIAGNOSIS — H2513 Age-related nuclear cataract, bilateral: Secondary | ICD-10-CM | POA: Diagnosis not present

## 2019-04-05 DIAGNOSIS — E119 Type 2 diabetes mellitus without complications: Secondary | ICD-10-CM | POA: Diagnosis not present

## 2019-04-07 DIAGNOSIS — E119 Type 2 diabetes mellitus without complications: Secondary | ICD-10-CM | POA: Diagnosis not present

## 2019-04-07 DIAGNOSIS — F329 Major depressive disorder, single episode, unspecified: Secondary | ICD-10-CM | POA: Diagnosis not present

## 2019-04-07 DIAGNOSIS — G309 Alzheimer's disease, unspecified: Secondary | ICD-10-CM | POA: Diagnosis not present

## 2019-04-07 DIAGNOSIS — F0280 Dementia in other diseases classified elsewhere without behavioral disturbance: Secondary | ICD-10-CM | POA: Diagnosis not present

## 2019-04-07 DIAGNOSIS — I10 Essential (primary) hypertension: Secondary | ICD-10-CM | POA: Diagnosis not present

## 2019-04-07 DIAGNOSIS — L89152 Pressure ulcer of sacral region, stage 2: Secondary | ICD-10-CM | POA: Diagnosis not present

## 2019-04-10 DIAGNOSIS — F0280 Dementia in other diseases classified elsewhere without behavioral disturbance: Secondary | ICD-10-CM | POA: Diagnosis not present

## 2019-04-10 DIAGNOSIS — G309 Alzheimer's disease, unspecified: Secondary | ICD-10-CM | POA: Diagnosis not present

## 2019-04-10 DIAGNOSIS — L89152 Pressure ulcer of sacral region, stage 2: Secondary | ICD-10-CM | POA: Diagnosis not present

## 2019-04-10 DIAGNOSIS — F329 Major depressive disorder, single episode, unspecified: Secondary | ICD-10-CM | POA: Diagnosis not present

## 2019-04-10 DIAGNOSIS — E039 Hypothyroidism, unspecified: Secondary | ICD-10-CM | POA: Diagnosis not present

## 2019-04-10 DIAGNOSIS — E119 Type 2 diabetes mellitus without complications: Secondary | ICD-10-CM | POA: Diagnosis not present

## 2019-04-10 DIAGNOSIS — Z7984 Long term (current) use of oral hypoglycemic drugs: Secondary | ICD-10-CM | POA: Diagnosis not present

## 2019-04-10 DIAGNOSIS — Z4800 Encounter for change or removal of nonsurgical wound dressing: Secondary | ICD-10-CM | POA: Diagnosis not present

## 2019-04-10 DIAGNOSIS — I10 Essential (primary) hypertension: Secondary | ICD-10-CM | POA: Diagnosis not present

## 2019-04-10 DIAGNOSIS — E785 Hyperlipidemia, unspecified: Secondary | ICD-10-CM | POA: Diagnosis not present

## 2019-04-13 DIAGNOSIS — I10 Essential (primary) hypertension: Secondary | ICD-10-CM | POA: Diagnosis not present

## 2019-04-13 DIAGNOSIS — F329 Major depressive disorder, single episode, unspecified: Secondary | ICD-10-CM | POA: Diagnosis not present

## 2019-04-13 DIAGNOSIS — F0280 Dementia in other diseases classified elsewhere without behavioral disturbance: Secondary | ICD-10-CM | POA: Diagnosis not present

## 2019-04-13 DIAGNOSIS — G309 Alzheimer's disease, unspecified: Secondary | ICD-10-CM | POA: Diagnosis not present

## 2019-04-13 DIAGNOSIS — E119 Type 2 diabetes mellitus without complications: Secondary | ICD-10-CM | POA: Diagnosis not present

## 2019-04-13 DIAGNOSIS — L89152 Pressure ulcer of sacral region, stage 2: Secondary | ICD-10-CM | POA: Diagnosis not present

## 2019-04-15 DIAGNOSIS — E119 Type 2 diabetes mellitus without complications: Secondary | ICD-10-CM | POA: Diagnosis not present

## 2019-04-15 DIAGNOSIS — I10 Essential (primary) hypertension: Secondary | ICD-10-CM | POA: Diagnosis not present

## 2019-04-15 DIAGNOSIS — F0280 Dementia in other diseases classified elsewhere without behavioral disturbance: Secondary | ICD-10-CM | POA: Diagnosis not present

## 2019-04-15 DIAGNOSIS — G309 Alzheimer's disease, unspecified: Secondary | ICD-10-CM | POA: Diagnosis not present

## 2019-04-15 DIAGNOSIS — L89152 Pressure ulcer of sacral region, stage 2: Secondary | ICD-10-CM | POA: Diagnosis not present

## 2019-04-15 DIAGNOSIS — F329 Major depressive disorder, single episode, unspecified: Secondary | ICD-10-CM | POA: Diagnosis not present

## 2019-04-22 DIAGNOSIS — M21621 Bunionette of right foot: Secondary | ICD-10-CM | POA: Diagnosis not present

## 2019-04-22 DIAGNOSIS — M21622 Bunionette of left foot: Secondary | ICD-10-CM | POA: Diagnosis not present

## 2019-04-22 DIAGNOSIS — L602 Onychogryphosis: Secondary | ICD-10-CM | POA: Diagnosis not present

## 2019-04-22 DIAGNOSIS — L84 Corns and callosities: Secondary | ICD-10-CM | POA: Diagnosis not present

## 2019-04-22 DIAGNOSIS — E1151 Type 2 diabetes mellitus with diabetic peripheral angiopathy without gangrene: Secondary | ICD-10-CM | POA: Diagnosis not present

## 2019-04-26 DIAGNOSIS — E559 Vitamin D deficiency, unspecified: Secondary | ICD-10-CM | POA: Diagnosis not present

## 2019-04-26 DIAGNOSIS — G308 Other Alzheimer's disease: Secondary | ICD-10-CM | POA: Diagnosis not present

## 2019-04-26 DIAGNOSIS — E039 Hypothyroidism, unspecified: Secondary | ICD-10-CM | POA: Diagnosis not present

## 2019-04-26 DIAGNOSIS — F329 Major depressive disorder, single episode, unspecified: Secondary | ICD-10-CM | POA: Diagnosis not present

## 2019-05-17 DIAGNOSIS — I10 Essential (primary) hypertension: Secondary | ICD-10-CM | POA: Diagnosis not present

## 2019-05-17 DIAGNOSIS — R32 Unspecified urinary incontinence: Secondary | ICD-10-CM | POA: Diagnosis not present

## 2019-05-17 DIAGNOSIS — E7849 Other hyperlipidemia: Secondary | ICD-10-CM | POA: Diagnosis not present

## 2019-05-31 DIAGNOSIS — E039 Hypothyroidism, unspecified: Secondary | ICD-10-CM | POA: Diagnosis not present

## 2019-05-31 DIAGNOSIS — E119 Type 2 diabetes mellitus without complications: Secondary | ICD-10-CM | POA: Diagnosis not present

## 2019-05-31 DIAGNOSIS — R001 Bradycardia, unspecified: Secondary | ICD-10-CM | POA: Diagnosis not present

## 2019-05-31 DIAGNOSIS — R2681 Unsteadiness on feet: Secondary | ICD-10-CM | POA: Diagnosis not present

## 2019-07-26 DIAGNOSIS — G308 Other Alzheimer's disease: Secondary | ICD-10-CM | POA: Diagnosis not present

## 2019-07-26 DIAGNOSIS — F329 Major depressive disorder, single episode, unspecified: Secondary | ICD-10-CM | POA: Diagnosis not present

## 2019-07-26 DIAGNOSIS — E559 Vitamin D deficiency, unspecified: Secondary | ICD-10-CM | POA: Diagnosis not present

## 2019-08-22 DIAGNOSIS — R05 Cough: Secondary | ICD-10-CM | POA: Diagnosis not present

## 2019-08-23 DIAGNOSIS — R635 Abnormal weight gain: Secondary | ICD-10-CM | POA: Diagnosis not present

## 2019-08-23 DIAGNOSIS — J069 Acute upper respiratory infection, unspecified: Secondary | ICD-10-CM | POA: Diagnosis not present

## 2019-08-23 DIAGNOSIS — I10 Essential (primary) hypertension: Secondary | ICD-10-CM | POA: Diagnosis not present

## 2019-08-23 DIAGNOSIS — E7849 Other hyperlipidemia: Secondary | ICD-10-CM | POA: Diagnosis not present

## 2019-08-29 DIAGNOSIS — M21621 Bunionette of right foot: Secondary | ICD-10-CM | POA: Diagnosis not present

## 2019-08-29 DIAGNOSIS — E1151 Type 2 diabetes mellitus with diabetic peripheral angiopathy without gangrene: Secondary | ICD-10-CM | POA: Diagnosis not present

## 2019-08-29 DIAGNOSIS — L84 Corns and callosities: Secondary | ICD-10-CM | POA: Diagnosis not present

## 2019-08-29 DIAGNOSIS — M21622 Bunionette of left foot: Secondary | ICD-10-CM | POA: Diagnosis not present

## 2019-08-29 DIAGNOSIS — L602 Onychogryphosis: Secondary | ICD-10-CM | POA: Diagnosis not present

## 2019-08-30 DIAGNOSIS — I10 Essential (primary) hypertension: Secondary | ICD-10-CM | POA: Diagnosis not present

## 2019-08-30 DIAGNOSIS — G308 Other Alzheimer's disease: Secondary | ICD-10-CM | POA: Diagnosis not present

## 2019-08-30 DIAGNOSIS — J069 Acute upper respiratory infection, unspecified: Secondary | ICD-10-CM | POA: Diagnosis not present

## 2019-09-20 DIAGNOSIS — E039 Hypothyroidism, unspecified: Secondary | ICD-10-CM | POA: Diagnosis not present

## 2019-09-20 DIAGNOSIS — F329 Major depressive disorder, single episode, unspecified: Secondary | ICD-10-CM | POA: Diagnosis not present

## 2019-09-20 DIAGNOSIS — E1151 Type 2 diabetes mellitus with diabetic peripheral angiopathy without gangrene: Secondary | ICD-10-CM | POA: Diagnosis not present

## 2019-09-20 DIAGNOSIS — R634 Abnormal weight loss: Secondary | ICD-10-CM | POA: Diagnosis not present

## 2019-09-20 DIAGNOSIS — Z79899 Other long term (current) drug therapy: Secondary | ICD-10-CM | POA: Diagnosis not present

## 2019-10-11 DIAGNOSIS — Z23 Encounter for immunization: Secondary | ICD-10-CM | POA: Diagnosis not present

## 2019-10-12 DIAGNOSIS — E1151 Type 2 diabetes mellitus with diabetic peripheral angiopathy without gangrene: Secondary | ICD-10-CM | POA: Diagnosis not present

## 2019-10-12 DIAGNOSIS — Z79899 Other long term (current) drug therapy: Secondary | ICD-10-CM | POA: Diagnosis not present

## 2019-10-18 DIAGNOSIS — R635 Abnormal weight gain: Secondary | ICD-10-CM | POA: Diagnosis not present

## 2019-10-18 DIAGNOSIS — I959 Hypotension, unspecified: Secondary | ICD-10-CM | POA: Diagnosis not present

## 2019-10-18 DIAGNOSIS — E119 Type 2 diabetes mellitus without complications: Secondary | ICD-10-CM | POA: Diagnosis not present

## 2019-10-18 DIAGNOSIS — D7589 Other specified diseases of blood and blood-forming organs: Secondary | ICD-10-CM | POA: Diagnosis not present

## 2019-11-15 DIAGNOSIS — E7849 Other hyperlipidemia: Secondary | ICD-10-CM | POA: Diagnosis not present

## 2019-11-15 DIAGNOSIS — D7589 Other specified diseases of blood and blood-forming organs: Secondary | ICD-10-CM | POA: Diagnosis not present

## 2019-11-15 DIAGNOSIS — I10 Essential (primary) hypertension: Secondary | ICD-10-CM | POA: Diagnosis not present

## 2019-11-15 DIAGNOSIS — G308 Other Alzheimer's disease: Secondary | ICD-10-CM | POA: Diagnosis not present

## 2019-11-16 DIAGNOSIS — Z23 Encounter for immunization: Secondary | ICD-10-CM | POA: Diagnosis not present

## 2019-12-23 DIAGNOSIS — T189XXA Foreign body of alimentary tract, part unspecified, initial encounter: Secondary | ICD-10-CM | POA: Diagnosis not present

## 2019-12-27 DIAGNOSIS — E039 Hypothyroidism, unspecified: Secondary | ICD-10-CM | POA: Diagnosis not present

## 2019-12-27 DIAGNOSIS — R635 Abnormal weight gain: Secondary | ICD-10-CM | POA: Diagnosis not present

## 2019-12-27 DIAGNOSIS — T189XXA Foreign body of alimentary tract, part unspecified, initial encounter: Secondary | ICD-10-CM | POA: Diagnosis not present

## 2020-01-01 ENCOUNTER — Emergency Department
Admission: EM | Admit: 2020-01-01 | Discharge: 2020-01-01 | Disposition: A | Discharge status: Home / Self Care | Payer: Medicare Other | Attending: Emergency Medicine | Admitting: Emergency Medicine

## 2020-01-01 ENCOUNTER — Other Ambulatory Visit: Payer: Self-pay

## 2020-01-01 ENCOUNTER — Emergency Department: Payer: Medicare Other

## 2020-01-01 DIAGNOSIS — S0101XA Laceration without foreign body of scalp, initial encounter: Secondary | ICD-10-CM | POA: Diagnosis not present

## 2020-01-01 DIAGNOSIS — Y92129 Unspecified place in nursing home as the place of occurrence of the external cause: Secondary | ICD-10-CM | POA: Diagnosis not present

## 2020-01-01 DIAGNOSIS — E039 Hypothyroidism, unspecified: Secondary | ICD-10-CM | POA: Diagnosis not present

## 2020-01-01 DIAGNOSIS — Z7984 Long term (current) use of oral hypoglycemic drugs: Secondary | ICD-10-CM | POA: Insufficient documentation

## 2020-01-01 DIAGNOSIS — R9082 White matter disease, unspecified: Secondary | ICD-10-CM | POA: Diagnosis not present

## 2020-01-01 DIAGNOSIS — W19XXXA Unspecified fall, initial encounter: Secondary | ICD-10-CM | POA: Diagnosis not present

## 2020-01-01 DIAGNOSIS — Z23 Encounter for immunization: Secondary | ICD-10-CM | POA: Diagnosis not present

## 2020-01-01 DIAGNOSIS — I10 Essential (primary) hypertension: Secondary | ICD-10-CM | POA: Diagnosis not present

## 2020-01-01 DIAGNOSIS — S0990XA Unspecified injury of head, initial encounter: Secondary | ICD-10-CM | POA: Diagnosis present

## 2020-01-01 DIAGNOSIS — Z7982 Long term (current) use of aspirin: Secondary | ICD-10-CM | POA: Insufficient documentation

## 2020-01-01 DIAGNOSIS — R404 Transient alteration of awareness: Secondary | ICD-10-CM | POA: Diagnosis not present

## 2020-01-01 DIAGNOSIS — Z79899 Other long term (current) drug therapy: Secondary | ICD-10-CM | POA: Insufficient documentation

## 2020-01-01 DIAGNOSIS — F0390 Unspecified dementia without behavioral disturbance: Secondary | ICD-10-CM | POA: Insufficient documentation

## 2020-01-01 DIAGNOSIS — M62838 Other muscle spasm: Secondary | ICD-10-CM | POA: Diagnosis not present

## 2020-01-01 DIAGNOSIS — E119 Type 2 diabetes mellitus without complications: Secondary | ICD-10-CM | POA: Diagnosis not present

## 2020-01-01 MED ORDER — TETANUS-DIPHTH-ACELL PERTUSSIS 5-2.5-18.5 LF-MCG/0.5 IM SUSY
0.5000 mL | PREFILLED_SYRINGE | Freq: Once | INTRAMUSCULAR | Status: AC
  Administered 2020-01-01: 0.5 mL via INTRAMUSCULAR
  Filled 2020-01-01: qty 0.5

## 2020-01-01 NOTE — ED Provider Notes (Signed)
[facility] Emergency Department Provider Note  ____________________________________________  Time seen: Approximately 8:21 AM  I have reviewed the triage vital signs and the nursing notes.   HISTORY  Chief Complaint Fall    Level 5 Caveat: Portions of the History and Physical including HPI and review of systems are unable to be completely obtained due to patient being a poor historian   HPI Frances [name] is a 74 y.o. female with a history of dementia diabetes hypertension who comes the ED after an unwitnessed fall at her memory care center. Patient is not able to provide any meaningful history, just makes repetitive sounds.      Past Medical History:  Diagnosis Date  . Cystocele   . Dementia ([place])   . Depressed   . Diabetes mellitus   . Endometriosis   . Hypertension   . Rectocele   . Thyroid disease   . Urinary incontinence    USI     Patient Active Problem List   Diagnosis Date Noted  . Bacteremia 08/27/2015  . Altered mental status   . Sepsis ([place]) 08/23/2015  . UTI (lower urinary tract infection) 08/23/2015  . Leukocytosis 08/23/2015  . Fever 08/23/2015  . Urinary incontinence   . Cystocele   . Rectocele   . Hypothyroid   . Endometriosis   . Dementia ([place])   . Diabetes [facility])      Past Surgical History:  Procedure Laterality Date  . basal cell excised    . BLADDER SUSPENSION     A/P Repair-Sling  . HEMORRHOID SURGERY    . KNEE SURGERY    . OOPHORECTOMY  1992   BSO  . TONSILLECTOMY    . VAGINAL HYSTERECTOMY  1992   Vag Hyst BSO     Prior to Admission medications   Medication Sig Start Date End Date Taking? Authorizing Provider  acetaminophen (TYLENOL) 325 MG tablet Take 2 tablets (650 mg total) by mouth every 4 (four) hours as needed for mild pain (or Fever >/= 101). 08/28/15   Eulogio Bear U, DO  amLODipine (NORVASC) 10 MG tablet Take 10 mg by mouth daily.  01/18/14   [provider]  aspirin EC 81 MG  tablet Take 81 mg by mouth at bedtime.     [provider]  atorvastatin (LIPITOR) 20 MG tablet Take 20 mg by mouth daily.    [provider]  cholecalciferol (VITAMIN D) 400 UNITS TABS tablet Take 400 Units by mouth daily.     [provider]  co-enzyme Q-10 30 MG capsule Take 30 mg by mouth daily.    [provider]  donepezil (ARICEPT) 23 MG TABS tablet Take 23 mg by mouth at bedtime.  07/31/15   [provider]  escitalopram (LEXAPRO) 10 MG tablet TAKE 1 TABLET DAILY Patient taking differently: TAKE 1 BY MOUTH TABLET DAILY 07/18/15   Star Age, MD  fosinopril (MONOPRIL) 40 MG tablet Take 40 mg by mouth daily.    [provider]  levothyroxine (SYNTHROID, LEVOTHROID) 75 MCG tablet Take 75 mcg by mouth daily.    [provider]  Memantine HCl ER (NAMENDA XR) 28 MG CP24 Take 1 capsule by mouth daily.    [provider]  metFORMIN (GLUCOPHAGE) 1000 MG tablet Take 1,000 mg by mouth daily with breakfast.  07/07/14   [provider]  ONETOUCH VERIO test strip USE 1 STRIP TO CHECK GLUCOSE ONCE DAILY ALTERNATING AM AND PM BEFORE MEALS &AMP AS DIRECTED  FINGER STICK FOR 90 DAYS 06/03/17   [provider]  saccharomyces boulardii (FLORASTOR) 250 MG capsule Take 1 capsule (250 mg total) by mouth 2 (two) times daily. 08/28/15   Geradine Girt, DO  vitamin E (VITAMIN E) 1000 UNIT capsule Take 2,000 Units by mouth daily.    [provider]     Allergies Shellfish allergy and Demerol [meperidine]   Family History  Problem Relation Age of Onset  . Hypertension Mother   . Stroke Mother   . Heart failure Mother   . Hypertension Father   . Diabetes Sister   . Hypertension Sister   . Breast cancer Sister   . Heart disease Sister     Social History Social History   Tobacco Use  . Smoking status: Never Smoker  . Smokeless tobacco: Never Used  Substance Use Topics  . Alcohol use: No    Review of  Systems Level 5 Caveat: Portions of the History and Physical including HPI and review of systems are unable to be completely obtained due to patient being a poor historian   Constitutional:   No known fever.  Cardiovascular:   No chest pain. Respiratory:   No dyspnea or cough. Gastrointestinal:   Negative for abdominal pain, vomiting and diarrhea.  Musculoskeletal:   Negative for focal pain or swelling ____________________________________________   PHYSICAL EXAM:  VITAL SIGNS: ED Triage Vitals  Enc Vitals Group     BP 01/01/20 0603 129/82     Pulse Rate 01/01/20 0603 89     Resp 01/01/20 0603 18     Temp 01/01/20 0606 98.4 F (36.9 C)     Temp Source 01/01/20 0603 Oral     SpO2 01/01/20 0603 96 %     Weight 01/01/20 0602 160 lb (72.6 kg)     Height 01/01/20 0602 5\' 9"  (1.753 m)     Head Circumference --      Peak Flow --      Pain Score --      Pain Loc --      Pain Edu? --      Excl. in [place]? --     Vital signs reviewed, nursing assessments reviewed.   Constitutional: Awake and alert. Not oriented.. Non-toxic appearance. Eyes:   Conjunctivae are normal. EOMI. PERRL. ENT      Head:   Normocephalic with small hematoma over the right supraorbital ridge and lateral eyebrow with a 2 cm laceration which is not gaping. Wound is hemostatic.Marland Kitchen      Nose:   No congestion/rhinnorhea.       Mouth/Throat:   MMM, no pharyngeal erythema. No peritonsillar mass.       Neck:   No meningismus. Full ROM. No midline tenderness Hematological/Lymphatic/Immunilogical:   No cervical lymphadenopathy. Cardiovascular:   RRR. Symmetric bilateral radial and DP pulses.  No murmurs. Cap refill less than 2 seconds. Respiratory:   Normal respiratory effort without tachypnea/retractions. Breath sounds are clear and equal bilaterally. No wheezes/rales/rhonchi. Gastrointestinal:   Soft and nontender. Non distended. There is no CVA tenderness.  No rebound, rigidity, or guarding. Musculoskeletal:   Normal  range of motion in all extremities. No joint effusions.  No lower extremity tenderness.  No edema. Neurologic:   Repetitive incoherent speech Motor grossly intact. No acute focal neurologic deficits are appreciated.  Skin:    Skin is warm, dry with forehead laceration as above, otherwise intact. No rash noted.  No petechiae, purpura, or bullae.  ____________________________________________  LABS (pertinent positives/negatives) (all labs ordered are listed, but only abnormal results are displayed) Labs Reviewed - No data to display ____________________________________________   EKG    ____________________________________________    RADIOLOGY  CT Head Wo Contrast  Result Date: 01/01/2020 CLINICAL DATA:  Unwitnessed fall. EXAM: CT HEAD WITHOUT CONTRAST CT CERVICAL SPINE WITHOUT CONTRAST TECHNIQUE: Multidetector CT imaging of the head and cervical spine was performed following the standard protocol without intravenous contrast. Multiplanar CT image reconstructions of the cervical spine were also generated. COMPARISON:  Head CT 08/23/2015. Head and cervical spine CT 01/25/2015 FINDINGS: CT HEAD FINDINGS Brain: There is no evidence for acute hemorrhage, hydrocephalus, mass lesion, or abnormal extra-axial fluid collection. No definite CT evidence for acute infarction. Diffuse loss of parenchymal volume is consistent with atrophy. Patchy low attenuation in the deep hemispheric and periventricular white matter is nonspecific, but likely reflects chronic microvascular ischemic demyelination. Vascular: No hyperdense vessel or unexpected calcification. Skull: No evidence for fracture. No worrisome lytic or sclerotic lesion. Sinuses/Orbits: The visualized paranasal sinuses and mastoid air cells are clear. Visualized portions of the globes and intraorbital fat are unremarkable. Other: None. CT CERVICAL SPINE FINDINGS Alignment: Straightening of normal cervical lordosis. No subluxation. Skull base and  vertebrae: No acute fracture. No primary bone lesion or focal pathologic process. Soft tissues and spinal canal: No prevertebral fluid or swelling. No visible canal hematoma. Disc levels: Loss of disc height noted at C4-5 and C5-6. Facets are well aligned bilaterally. Upper chest: Negative. Other: None. IMPRESSION: 1. No acute intracranial abnormality. Atrophy with chronic small vessel white matter ischemic disease. 2. Degenerative changes in the cervical spine without fracture. 3. Loss of cervical lordosis. This can be related to patient positioning, muscle spasm or soft tissue injury. Electronically Signed   By: Misty Stanley M.D.   On: 01/01/2020 07:38   CT Cervical Spine Wo Contrast  Result Date: 01/01/2020 CLINICAL DATA:  Unwitnessed fall. EXAM: CT HEAD WITHOUT CONTRAST CT CERVICAL SPINE WITHOUT CONTRAST TECHNIQUE: Multidetector CT imaging of the head and cervical spine was performed following the standard protocol without intravenous contrast. Multiplanar CT image reconstructions of the cervical spine were also generated. COMPARISON:  Head CT 08/23/2015. Head and cervical spine CT 01/25/2015 FINDINGS: CT HEAD FINDINGS Brain: There is no evidence for acute hemorrhage, hydrocephalus, mass lesion, or abnormal extra-axial fluid collection. No definite CT evidence for acute infarction. Diffuse loss of parenchymal volume is consistent with atrophy. Patchy low attenuation in the deep hemispheric and periventricular white matter is nonspecific, but likely reflects chronic microvascular ischemic demyelination. Vascular: No hyperdense vessel or unexpected calcification. Skull: No evidence for fracture. No worrisome lytic or sclerotic lesion. Sinuses/Orbits: The visualized paranasal sinuses and mastoid air cells are clear. Visualized portions of the globes and intraorbital fat are unremarkable. Other: None. CT CERVICAL SPINE FINDINGS Alignment: Straightening of normal cervical lordosis. No subluxation. Skull base  and vertebrae: No acute fracture. No primary bone lesion or focal pathologic process. Soft tissues and spinal canal: No prevertebral fluid or swelling. No visible canal hematoma. Disc levels: Loss of disc height noted at C4-5 and C5-6. Facets are well aligned bilaterally. Upper chest: Negative. Other: None. IMPRESSION: 1. No acute intracranial abnormality. Atrophy with chronic small vessel white matter ischemic disease. 2. Degenerative changes in the cervical spine without fracture. 3. Loss of cervical lordosis. This can be related to patient positioning, muscle spasm or soft tissue injury. Electronically Signed   By: Misty Stanley M.D.   On: 01/01/2020 07:38  ____________________________________________   PROCEDURES .Marland KitchenLaceration Repair  Date/Time: 01/01/2020 8:27 AM Performed by: Carrie Mew, MD Authorized by: Carrie Mew, MD   Consent:    Consent obtained:  Emergent situation   Risks, benefits, and alternatives were discussed: not applicable     Risks discussed:  Infection, pain, retained foreign body, poor cosmetic result, poor wound healing and need for additional repair Universal protocol:    Patient identity confirmed:  Arm band Anesthesia:    Anesthesia method:  None Laceration details:    Location:  Scalp   Scalp location:  Frontal   Length (cm):  2 Pre-procedure details:    Preparation:  Patient was prepped and draped in usual sterile fashion and imaging obtained to evaluate for foreign bodies Exploration:    Hemostasis achieved with:  Direct pressure   Wound exploration: entire depth of wound visualized     Wound extent: no foreign bodies/material noted, no muscle damage noted, no underlying fracture noted and no vascular damage noted     Contaminated: no   Treatment:    Area cleansed with:  Saline and povidone-iodine   Amount of cleaning:  Extensive   Irrigation solution:  Sterile saline   Visualized foreign bodies/material removed: no     Debridement:   None   Undermining:  None   Scar revision: no   Skin repair:    Repair method:  Tissue adhesive Approximation:    Approximation:  Close Repair type:    Repair type:  Simple Post-procedure details:    Dressing:  Sterile dressing   Procedure completion:  Tolerated well, no immediate complications    ____________________________________________  DIFFERENTIAL DIAGNOSIS   Intracranial hemorrhage, C-spine fracture  CLINICAL IMPRESSION / ASSESSMENT AND PLAN / ED COURSE  Medications ordered in the ED: Medications  Tdap (BOOSTRIX) injection 0.5 mL (0.5 mLs Intramuscular Given 01/01/20 0801)    Pertinent labs & imaging results that were available during my care of the patient were reviewed by me and considered in my medical decision making (see chart for details).   Frances [name] was evaluated in Emergency Department on 01/01/2020 for the symptoms described in the history of present illness. She was evaluated in the context of the global COVID-19 pandemic, which necessitated consideration that the patient might be at risk for infection with the SARS-CoV-2 virus that causes COVID-19. Institutional protocols and algorithms that pertain to the evaluation of patients at risk for COVID-19 are in a state of rapid change based on information released by regulatory bodies including the CDC and federal and state organizations. These policies and algorithms were followed during the patient's care in the ED.   Patient with dementia presents after a fall. CT head and neck negative for serious acute injury such as subdural hematoma, epidural hematoma, subarachnoid hemorrhage, C-spine fracture. The rest of her exam is unremarkable, no evidence of any other joint or bony injuries.  Wound is repaired with tissue adhesive. Stable for discharge home.      ____________________________________________   FINAL CLINICAL IMPRESSION(S) / ED DIAGNOSES    Final diagnoses:  Laceration of scalp,  initial encounter  Fall, initial encounter     ED Discharge Orders    None      Portions of this note were generated with dragon dictation software. Dictation errors may occur despite best attempts at proofreading.   Carrie Mew, MD 01/01/20 [phone]

## 2020-01-01 NOTE — ED Triage Notes (Signed)
Pt from [facility] with unwitnessed fall. Pt with laceration noted above right eyebrow with controlled bleeding. Pt with history of dementia.

## 2020-01-01 NOTE — ED Notes (Signed)
Pt sister, Wendall Papa ready to pick pt up. Pt unable to sign d/c signature. Notified [facility] and POA that pt is on the way back to the facility.

## 2020-01-01 NOTE — ED Notes (Addendum)
Spoke with the pt's POA, Merelyn Klump, states that her sister, Wendall Papa, pt's sister will be getting her back to [facility] at this time.

## 2020-01-01 NOTE — ED Notes (Addendum)
Megan, rn primary rn at bedside during triage.

## 2020-01-01 NOTE — ED Notes (Signed)
Report received from [place], [place]. Pt is alert but disoriented x4 r/t hx of dementia. No needs expressed at this time. Pt laying comfortably in the bed. ED stretcher locked and in lowest position. Call bell within reach.

## 2020-01-01 NOTE — Discharge Instructions (Addendum)
You have a small laceration over the right eyebrow.  This was repaired with tissue adhesive.  Keep the area clean and dry for the next 3 days.  Do not rub the area so that the wound can heal.

## 2021-07-29 ENCOUNTER — Other Ambulatory Visit: Payer: Self-pay

## 2021-07-29 ENCOUNTER — Emergency Department: Payer: Medicare HMO

## 2021-07-29 ENCOUNTER — Encounter: Payer: Self-pay | Admitting: Emergency Medicine

## 2021-07-29 ENCOUNTER — Emergency Department
Admission: EM | Admit: 2021-07-29 | Discharge: 2021-07-29 | Disposition: A | Discharge status: Home / Self Care | Payer: Medicare HMO | Attending: Emergency Medicine | Admitting: Emergency Medicine

## 2021-07-29 DIAGNOSIS — F0390 Unspecified dementia without behavioral disturbance: Secondary | ICD-10-CM | POA: Diagnosis not present

## 2021-07-29 DIAGNOSIS — S065X0A Traumatic subdural hemorrhage without loss of consciousness, initial encounter: Secondary | ICD-10-CM | POA: Diagnosis not present

## 2021-07-29 DIAGNOSIS — S065XAA Traumatic subdural hemorrhage with loss of consciousness status unknown, initial encounter: Secondary | ICD-10-CM

## 2021-07-29 DIAGNOSIS — W01198A Fall on same level from slipping, tripping and stumbling with subsequent striking against other object, initial encounter: Secondary | ICD-10-CM | POA: Diagnosis not present

## 2021-07-29 DIAGNOSIS — S0101XA Laceration without foreign body of scalp, initial encounter: Secondary | ICD-10-CM | POA: Diagnosis not present

## 2021-07-29 DIAGNOSIS — Z23 Encounter for immunization: Secondary | ICD-10-CM | POA: Diagnosis not present

## 2021-07-29 DIAGNOSIS — W19XXXA Unspecified fall, initial encounter: Secondary | ICD-10-CM

## 2021-07-29 DIAGNOSIS — S0990XA Unspecified injury of head, initial encounter: Secondary | ICD-10-CM | POA: Diagnosis present

## 2021-07-29 LAB — CBC WITH DIFFERENTIAL/PLATELET
Abs Immature Granulocytes: 0.02 10*3/uL (ref 0.00–0.07)
Basophils Absolute: 0 10*3/uL (ref 0.0–0.1)
Basophils Relative: 1 %
Eosinophils Absolute: 0.1 10*3/uL (ref 0.0–0.5)
Eosinophils Relative: 2 %
HCT: 41.6 % (ref 36.0–46.0)
Hemoglobin: 13.4 g/dL (ref 12.0–15.0)
Immature Granulocytes: 0 %
Lymphocytes Relative: 16 %
Lymphs Abs: 1 10*3/uL (ref 0.7–4.0)
MCH: 29.6 pg (ref 26.0–34.0)
MCHC: 32.2 g/dL (ref 30.0–36.0)
MCV: 91.8 fL (ref 80.0–100.0)
Monocytes Absolute: 0.4 10*3/uL (ref 0.1–1.0)
Monocytes Relative: 7 %
Neutro Abs: 4.7 10*3/uL (ref 1.7–7.7)
Neutrophils Relative %: 74 %
Platelets: 234 10*3/uL (ref 150–400)
RBC: 4.53 MIL/uL (ref 3.87–5.11)
RDW: 13.2 % (ref 11.5–15.5)
WBC: 6.3 10*3/uL (ref 4.0–10.5)
nRBC: 0 % (ref 0.0–0.2)

## 2021-07-29 LAB — BASIC METABOLIC PANEL
Anion gap: 8 (ref 5–15)
BUN: 19 mg/dL (ref 8–23)
CO2: 28 mmol/L (ref 22–32)
Calcium: 9.6 mg/dL (ref 8.9–10.3)
Chloride: 106 mmol/L (ref 98–111)
Creatinine, Ser: 0.98 mg/dL (ref 0.44–1.00)
GFR, Estimated: 60 mL/min — ABNORMAL LOW (ref >60)
Glucose, Bld: 107 mg/dL — ABNORMAL HIGH (ref 70–99)
Potassium: 4.8 mmol/L (ref 3.5–5.1)
Sodium: 142 mmol/L (ref 135–145)

## 2021-07-29 MED ORDER — ACETAMINOPHEN 500 MG PO TABS
1000.0000 mg | ORAL_TABLET | Freq: Once | ORAL | Status: AC
  Administered 2021-07-29: 1000 mg via ORAL
  Filled 2021-07-29: qty 2

## 2021-07-29 MED ORDER — TETANUS-DIPHTH-ACELL PERTUSSIS 5-2.5-18.5 LF-MCG/0.5 IM SUSY
0.5000 mL | PREFILLED_SYRINGE | Freq: Once | INTRAMUSCULAR | Status: AC
  Administered 2021-07-29: 0.5 mL via INTRAMUSCULAR
  Filled 2021-07-29: qty 0.5

## 2021-07-29 NOTE — ED Provider Notes (Addendum)
[facility] Provider Note    Event Date/Time   First MD Initiated Contact with Patient 07/29/21 1228     (approximate)   History   Fall   HPI  Frances [name] is a 76 y.o. female who comes in for a fall from [facility] after hitting her head on a handrail.  No blood thinners.  Patient reports that she tripped and fell and hit the back of her head.  Patient does have dementia and was reportedly at her normal baseline self.   Physical Exam   Triage Vital Signs: ED Triage Vitals [07/29/21 1224]  Enc Vitals Group     BP      Pulse      Resp      Temp      Temp src      SpO2      Weight 150 lb (68 kg)     Height      Head Circumference      Peak Flow      Pain Score      Pain Loc      Pain Edu?      Excl. in [place]?     Most recent vital signs: Vitals:   07/29/21 1237  BP: 133/74  Pulse: (!) 43  Resp: 14  Temp: 98.1 F (36.7 C)  SpO2: 94%     General: Awake, no distress.  CV:  Good peripheral perfusion.  Resp:  Normal effort.  Abd:  No distention.  Other:  Patient is able to pick up both of her legs.  She has no chest wall tenderness, abdominal pump tenderness.  She got hematoma to the back of the head with small abrasions with puncture wound noted with mild bleeding    ED Results / Procedures / Treatments   Labs (all labs ordered are listed, but only abnormal results are displayed) Labs Reviewed  BASIC METABOLIC PANEL - Abnormal; Notable for the following components:      Result Value   Glucose, Bld 107 (*)    GFR, Estimated 60 (*)    All other components within normal limits  CBC WITH DIFFERENTIAL/PLATELET     EKG  My interpretation of EKG:  Normal sinus rate of 83 without any ST elevation, no T wave inversions, normal intervals  RADIOLOGY I reviewed the CT head personally interpreted and there is small subdural hematoma  PROCEDURES:  Critical Care performed: No  ..Laceration Repair  Date/Time: 07/29/2021 3:03  PM  Performed by: Vanessa [place], MD Authorized by: Vanessa [place], MD   Consent:    Consent obtained:  Verbal   Consent given by:  Guardian and patient   Risks discussed:  Infection and pain   Alternatives discussed:  No treatment Universal protocol:    Patient identity confirmed:  Verbally with patient Anesthesia:    Anesthesia method:  None Laceration details:    Location:  Scalp   Scalp location:  R parietal   Length (cm):  2   Depth (mm):  1 Pre-procedure details:    Preparation:  Patient was prepped and draped in usual sterile fashion Treatment:    Area cleansed with:  Saline   Amount of cleaning:  Standard Skin repair:    Repair method:  Staples   Number of staples:  2 Approximation:    Approximation:  Close Repair type:    Repair type:  Simple Post-procedure details:    Procedure completion:  Tolerated well, no immediate complications  MEDICATIONS ORDERED IN ED: Medications - No data to display   IMPRESSION / MDM / [place] / ED COURSE  I reviewed the triage vital signs and the nursing notes.   Patient's presentation is most consistent with acute presentation with potential threat to life or bodily function.   Differential includes intercranial hemorrhage, cervical fracture.  No evidence of any fractures or injuries on the chest or abdominal injuries.  CT scan shows a small subdural hematoma therefore will get labs to make sure no evidence of any low platelets or anemia.  Will get EKG given the heart rate noted to be 43 but on EKG it was 83.  We will get some basic labs.  Discussed with Dr. Izora Ribas who is currently in surgery but I talked to one of his staff members and they agree with a repeat CT head in 6 hours.  BMP reassuring.  CBC reassuring.  Patient be handed off to oncoming team pending repeat CT head  Staples placed for a puncture wound to the right parietal area.  Family updated that we will need to be removed in 10 to 14 days      FINAL CLINICAL IMPRESSION(S) / ED DIAGNOSES   Final diagnoses:  Fall, initial encounter  Subdural hematoma ([place])     Rx / DC Orders   ED Discharge Orders     None        Note:  This document was prepared using Dragon voice recognition software and may include unintentional dictation errors.   Vanessa [place], MD 07/29/21 1433    Vanessa [place], MD 07/29/21 1433    Vanessa Edmonson, MD 07/29/21 1504

## 2021-07-29 NOTE — Discharge Instructions (Addendum)
Staples removed in 10-14 days.

## 2021-07-29 NOTE — ED Triage Notes (Signed)
Pt in with fall at [facility], staff stated she tripped and fell and hit the back of head with abrasion noted. Pt is normally confused, hx of dementia, staff stated she is at her norm.

## 2021-07-29 NOTE — ED Notes (Signed)
Pt to CT now

## 2021-07-29 NOTE — ED Triage Notes (Signed)
First RN note:  Per EMS, Pt, from [facility], presents after hitting head on a hand rail after a fall.  Denies blood thinners.  Per facility, Pt is at baseline.

## 2021-07-29 NOTE — ED Provider Notes (Signed)
-----------------------------------------   7:06 PM on 07/29/2021 -----------------------------------------   I took over care on this patient from Dr. Jari Pigg.  Repeat CT shows no interval change.  The patient appears comfortable and has had no change in her clinical status.  Based on my discussion with Dr. Izora Ribas from neurosurgery, the patient is appropriate for discharge home and does not require neurosurgical follow-up.  I counseled the family member on the results of the work-up and plan of care.  Return precautions given, and she expressed understanding.   Arta Silence, MD 07/29/21 1907

## 2021-10-15 ENCOUNTER — Emergency Department: Payer: Medicare HMO

## 2021-10-15 ENCOUNTER — Encounter: Payer: Self-pay | Admitting: Emergency Medicine

## 2021-10-15 ENCOUNTER — Other Ambulatory Visit: Payer: Self-pay

## 2021-10-15 ENCOUNTER — Emergency Department
Admission: EM | Admit: 2021-10-15 | Discharge: 2021-10-15 | Disposition: A | Discharge status: Home / Self Care | Payer: Medicare HMO | Attending: Emergency Medicine | Admitting: Emergency Medicine

## 2021-10-15 DIAGNOSIS — F0390 Unspecified dementia without behavioral disturbance: Secondary | ICD-10-CM | POA: Insufficient documentation

## 2021-10-15 DIAGNOSIS — I493 Ventricular premature depolarization: Secondary | ICD-10-CM | POA: Diagnosis not present

## 2021-10-15 DIAGNOSIS — R531 Weakness: Secondary | ICD-10-CM | POA: Insufficient documentation

## 2021-10-15 DIAGNOSIS — I10 Essential (primary) hypertension: Secondary | ICD-10-CM | POA: Diagnosis not present

## 2021-10-15 DIAGNOSIS — R262 Difficulty in walking, not elsewhere classified: Secondary | ICD-10-CM | POA: Insufficient documentation

## 2021-10-15 DIAGNOSIS — E119 Type 2 diabetes mellitus without complications: Secondary | ICD-10-CM | POA: Diagnosis not present

## 2021-10-15 LAB — CBC
HCT: 40.9 % (ref 36.0–46.0)
Hemoglobin: 13.3 g/dL (ref 12.0–15.0)
MCH: 30.4 pg (ref 26.0–34.0)
MCHC: 32.5 g/dL (ref 30.0–36.0)
MCV: 93.6 fL (ref 80.0–100.0)
Platelets: 282 10*3/uL (ref 150–400)
RBC: 4.37 MIL/uL (ref 3.87–5.11)
RDW: 12.6 % (ref 11.5–15.5)
WBC: 4.2 10*3/uL (ref 4.0–10.5)
nRBC: 0 % (ref 0.0–0.2)

## 2021-10-15 LAB — COMPREHENSIVE METABOLIC PANEL
ALT: 16 U/L (ref 0–44)
AST: 17 U/L (ref 15–41)
Albumin: 4.2 g/dL (ref 3.5–5.0)
Alkaline Phosphatase: 90 U/L (ref 38–126)
Anion gap: 8 (ref 5–15)
BUN: 15 mg/dL (ref 8–23)
CO2: 27 mmol/L (ref 22–32)
Calcium: 9.7 mg/dL (ref 8.9–10.3)
Chloride: 108 mmol/L (ref 98–111)
Creatinine, Ser: 1.01 mg/dL — ABNORMAL HIGH (ref 0.44–1.00)
GFR, Estimated: 58 mL/min — ABNORMAL LOW (ref >60)
Glucose, Bld: 132 mg/dL — ABNORMAL HIGH (ref 70–99)
Potassium: 4 mmol/L (ref 3.5–5.1)
Sodium: 143 mmol/L (ref 135–145)
Total Bilirubin: 0.8 mg/dL (ref 0.3–1.2)
Total Protein: 7.7 g/dL (ref 6.5–8.1)

## 2021-10-15 LAB — DIFFERENTIAL
Abs Immature Granulocytes: 0.01 10*3/uL (ref 0.00–0.07)
Basophils Absolute: 0 10*3/uL (ref 0.0–0.1)
Basophils Relative: 1 %
Eosinophils Absolute: 0.1 10*3/uL (ref 0.0–0.5)
Eosinophils Relative: 2 %
Immature Granulocytes: 0 %
Lymphocytes Relative: 29 %
Lymphs Abs: 1.2 10*3/uL (ref 0.7–4.0)
Monocytes Absolute: 0.3 10*3/uL (ref 0.1–1.0)
Monocytes Relative: 8 %
Neutro Abs: 2.6 10*3/uL (ref 1.7–7.7)
Neutrophils Relative %: 60 %

## 2021-10-15 LAB — APTT: aPTT: 29 seconds (ref 24–36)

## 2021-10-15 LAB — ETHANOL: Alcohol, Ethyl (B): <10 mg/dL (ref <10)

## 2021-10-15 LAB — PROTIME-INR
INR: 1.1 (ref 0.8–1.2)
Prothrombin Time: 14.5 seconds (ref 11.4–15.2)

## 2021-10-15 MED ORDER — SODIUM CHLORIDE 0.9% FLUSH
3.0000 mL | Freq: Once | INTRAVENOUS | Status: AC
  Administered 2021-10-15: 3 mL via INTRAVENOUS

## 2021-10-15 NOTE — ED Triage Notes (Signed)
Patient is a resident of [facility].  Patient's daughter states that this morning she noticed that patient was leaning in toward daughter, which is new.  Daughter then left and was called back by staff at around noon stating that patient leaning was worse / weaker with walking.

## 2021-10-15 NOTE — ED Provider Notes (Signed)
[facility] Provider Note    Event Date/Time   First MD Initiated Contact with Patient 10/15/21 1545     (approximate)   History   Chief Complaint Weakness   HPI  Frances [name] is a 76 y.o. female with past medical history of hypertension, diabetes, dementia, and endometriosis who presents to the ED complaining of weakness.  History is limited as patient has advanced dementia and is nonverbal at baseline.  History is obtained from friend at bedside, who is also patient's medical power of attorney.  She states that when she went to visit the patient earlier this morning, she noticed that she seemed to be leaning slightly towards the right.  She mentioned this to staff at [facility], but was not overly concerned and returned home afterwards.  Staff at [facility] then called patient's POA this afternoon, stating that she seemed to worsen with increased leaning to the right and difficulty walking.  Friend states that patient currently seems at her baseline, states the patient has not complained of anything recently.     Physical Exam   Triage Vital Signs: ED Triage Vitals  Enc Vitals Group     BP 10/15/21 1328 117/62     Pulse Rate 10/15/21 1328 67     Resp 10/15/21 1328 16     Temp 10/15/21 1328 97.8 F (36.6 C)     Temp Source 10/15/21 1328 Oral     SpO2 10/15/21 1328 95 %     Weight 10/15/21 1331 149 lb 14.6 oz (68 kg)     Height 10/15/21 1611 '5\' 9"'$  (1.753 m)     Head Circumference --      Peak Flow --      Pain Score --      Pain Loc --      Pain Edu? --      Excl. in [place]? --     Most recent vital signs: Vitals:   10/15/21 1328 10/15/21 1724  BP: 117/62 120/60  Pulse: 67 70  Resp: 16 16  Temp: 97.8 F (36.6 C) 98 F (36.7 C)  SpO2: 95% 96%    Constitutional: Alert and disoriented. Eyes: Conjunctivae are normal. Head: Atraumatic. Nose: No congestion/rhinnorhea. Mouth/Throat: Mucous membranes are moist.  Cardiovascular: Normal rate,  regular rhythm. Grossly normal heart sounds.  2+ radial pulses bilaterally. Respiratory: Normal respiratory effort.  No retractions. Lungs CTAB. Gastrointestinal: Soft and nontender. No distention. Musculoskeletal: No lower extremity tenderness nor edema.  Neurologic:  N mumbling and nonsensical speech, chronic per friend. No gross focal neurologic deficits are appreciated.  Ambulates with assistance.    ED Results / Procedures / Treatments   Labs (all labs ordered are listed, but only abnormal results are displayed) Labs Reviewed  COMPREHENSIVE METABOLIC PANEL - Abnormal; Notable for the following components:      Result Value   Glucose, Bld 132 (*)    Creatinine, Ser 1.01 (*)    GFR, Estimated 58 (*)    All other components within normal limits  PROTIME-INR  APTT  CBC  DIFFERENTIAL  ETHANOL     EKG  ED ECG REPORT I, Blake Divine, the attending physician, personally viewed and interpreted this ECG.   Date: 10/15/2021  EKG Time: 13:35  Rate: 89  Rhythm: normal sinus rhythm  Axis: Normal  Intervals:none  ST&T Change: None  RADIOLOGY CT head reviewed and interpreted by me with no hemorrhage or midline shift.  PROCEDURES:  Critical Care performed: No  Procedures  MEDICATIONS ORDERED IN ED: Medications  sodium chloride flush (NS) 0.9 % injection 3 mL (3 mLs Intravenous Given 10/15/21 1610)     IMPRESSION / MDM / ASSESSMENT AND PLAN / ED COURSE  I reviewed the triage vital signs and the nursing notes.                              76 y.o. female with past medical history of hypertension, diabetes, dementia, endometriosis who presents to the ED complaining of increased weakness from her baseline, found to be leaning to the right with difficulty walking by staff at her nursing facility earlier today.  Patient's presentation is most consistent with acute presentation with potential threat to life or bodily function.  Differential diagnosis includes, but is  not limited to, stroke, TIA, UTI, electrolyte abnormality, dementia.  Patient nontoxic-appearing and in no acute distress, vital signs are unremarkable.  She is disoriented but at her baseline mental status per friend and POA at bedside.  No apparent focal neurologic deficits on exam, patient moving all extremities equally and is able to ambulate with assistance, which is also her baseline per friend.  CT head performed and negative for acute process, labs are reassuring with no significant anemia, leukocytosis, electrolyte abnormality, or AKI.  Low suspicion for UTI at this time given patient at her baseline mental status with no symptoms or vital sign abnormality to suggest infection.  Given reassuring neurologic exam, doubt stroke.  Patient is appropriate for discharge back to nursing facility and medical POA is in agreement with plan.  Friend counseled to have her return to the ED for new worsening symptoms, friend agrees with plan.      FINAL CLINICAL IMPRESSION(S) / ED DIAGNOSES   Final diagnoses:  Generalized weakness  Dementia without behavioral disturbance, psychotic disturbance, mood disturbance, or anxiety, unspecified dementia severity, unspecified dementia type ([place])     Rx / DC Orders   ED Discharge Orders     None        Note:  This document was prepared using Dragon voice recognition software and may include unintentional dictation errors.   Blake Divine, MD 10/15/21 [phone]

## 2022-09-17 ENCOUNTER — Other Ambulatory Visit: Payer: Self-pay

## 2022-09-17 ENCOUNTER — Emergency Department: Payer: Medicare HMO

## 2022-09-17 ENCOUNTER — Emergency Department
Admission: EM | Admit: 2022-09-17 | Discharge: 2022-09-17 | Disposition: A | Discharge status: Home / Self Care | Payer: Medicare HMO | Attending: Emergency Medicine | Admitting: Emergency Medicine

## 2022-09-17 DIAGNOSIS — F0390 Unspecified dementia without behavioral disturbance: Secondary | ICD-10-CM | POA: Insufficient documentation

## 2022-09-17 DIAGNOSIS — S0990XA Unspecified injury of head, initial encounter: Secondary | ICD-10-CM

## 2022-09-17 DIAGNOSIS — S0101XA Laceration without foreign body of scalp, initial encounter: Secondary | ICD-10-CM | POA: Diagnosis not present

## 2022-09-17 DIAGNOSIS — W19XXXA Unspecified fall, initial encounter: Secondary | ICD-10-CM | POA: Insufficient documentation

## 2022-09-17 DIAGNOSIS — Y92129 Unspecified place in nursing home as the place of occurrence of the external cause: Secondary | ICD-10-CM | POA: Insufficient documentation

## 2022-09-17 MED ORDER — LIDOCAINE-EPINEPHRINE-TETRACAINE (LET) TOPICAL GEL
3.0000 mL | Freq: Once | TOPICAL | Status: AC
  Administered 2022-09-17: 3 mL via TOPICAL
  Filled 2022-09-17 (×2): qty 3

## 2022-09-17 NOTE — Discharge Instructions (Signed)
She was seen in the emergency department today for her fall with head injury.  CT imaging of her head and neck were reassuring with no acute injuries.  X-rays of her chest and pelvis were also reassuring with no traumatic injuries.  8 staples were placed in her scalp laceration.  These will need to be removed in 7 to 10 days.  Please have her follow-up with her primary care provider in the next couple days for reassessment.

## 2022-09-17 NOTE — ED Notes (Signed)
Pt to CT

## 2022-09-17 NOTE — ED Triage Notes (Signed)
Pt to ED [facility] from [facility] for unwitnessed fall, hit head. Laceration to top of head, Bleeding controlled. Disoriented and non verbal at baseline. No blood thinners Pt alert, looking around room. Does not answer questions or follow commands.

## 2022-09-17 NOTE — ED Provider Notes (Signed)
[facility] Provider Note    Event Date/Time   First MD Initiated Contact with Patient 09/17/22 1500     (approximate)   History   No chief complaint on file.   HPI Frances [name] is a 77 y.o. female with dementia and nonverbal at baseline presenting today for fall.  Patient reported had an unwitnessed fall at her facility and was found down on the ground.  Was noted to have a laceration to her scalp and was sent to the emergency department for further evaluation.  Patient is nonverbal at baseline.  No change in her baseline per facility or power of attorney that is in the room.  Is not on any blood thinners.  No other obvious trauma noted by facility.    Physical Exam   Triage Vital Signs: ED Triage Vitals  Encounter Vitals Group     BP 09/17/22 1441 121/78     Systolic BP Percentile --      Diastolic BP Percentile --      Pulse Rate 09/17/22 1449 99     Resp 09/17/22 1441 20     Temp --      Temp src --      SpO2 09/17/22 1449 100 %     Weight 09/17/22 1444 135 lb (61.2 kg)     Height --      Head Circumference --      Peak Flow --      Pain Score --      Pain Loc --      Pain Education --      Exclude from Growth Chart --     Most recent vital signs: Vitals:   09/17/22 1441 09/17/22 1449  BP: 121/78   Pulse:  99  Resp: 20   SpO2:  100%   Physical Exam: I have reviewed the vital signs and nursing notes. General: Awake, alert, no acute distress.  Nontoxic appearing. Head: Large laceration to the superior midline parietal scalp which is hemostatic at this time., normocephalic.   ENT:  EOM intact, PERRL. Oral mucosa is pink and moist with no lesions. Neck: Neck is supple with full range of motion, No meningeal signs. Cardiovascular:  RRR, No murmurs. Peripheral pulses palpable and equal bilaterally. Respiratory:  Symmetrical chest wall expansion.  No rhonchi, rales, or wheezes.  Good air movement throughout.  No use of accessory  muscles.   Musculoskeletal:  No cyanosis or edema. Moving extremities with full ROM Abdomen:  Soft, nontender, nondistended. Neuro: Nonverbal, looking around the room intermittently Psych:  Calm, appropriate.   Skin:  Warm, dry, no rash.     ED Results / Procedures / Treatments   Labs (all labs ordered are listed, but only abnormal results are displayed) Labs Reviewed - No data to display   EKG    RADIOLOGY See ED course for my independent interpretations   PROCEDURES:  Critical Care performed: No  ..Laceration Repair  Date/Time: 09/17/2022 6:55 PM  Performed by: Janith Lima, MD Authorized by: Janith Lima, MD   Consent:    Consent obtained:  Verbal   Consent given by:  Healthcare agent   Risks, benefits, and alternatives were discussed: yes     Risks discussed:  Infection, pain, poor cosmetic result and poor wound healing   Alternatives discussed:  No treatment Universal protocol:    Patient identity confirmed:  Arm band Anesthesia:    Anesthesia method:  Topical application   Topical  anesthetic:  LET Laceration details:    Location:  Scalp   Scalp location:  Crown   Length (cm):  6   Depth (mm):  4 Pre-procedure details:    Preparation:  Patient was prepped and draped in usual sterile fashion and imaging obtained to evaluate for foreign bodies Exploration:    Hemostasis achieved with:  Direct pressure and LET   Imaging obtained comment:  CT head   Imaging outcome: foreign body not noted     Wound exploration: wound explored through full range of motion     Contaminated: no   Treatment:    Area cleansed with:  Chlorhexidine   Amount of cleaning:  Standard   Irrigation solution:  Sterile saline   Irrigation volume:  500cc   Irrigation method:  Tap   Visualized foreign bodies/material removed: no     Debridement:  Minimal   Undermining:  None   Scar revision: no   Skin repair:    Repair method:  Staples   Number of staples:  8 Approximation:     Approximation:  Close Repair type:    Repair type:  Intermediate Post-procedure details:    Dressing:  Open (no dressing)   Procedure completion:  Tolerated well, no immediate complications    MEDICATIONS ORDERED IN ED: Medications  lidocaine-EPINEPHrine-tetracaine (LET) topical gel (3 mLs Topical Given 09/17/22 1746)     IMPRESSION / MDM / ASSESSMENT AND PLAN / ED COURSE  I reviewed the triage vital signs and the nursing notes.                              Differential diagnosis includes, but is not limited to, SAH, SDH, epidural hematoma, cervical spine injury, rib fractures, pelvic injury.  Patient's presentation is most consistent with acute presentation with potential threat to life or bodily function.  Patient is a 77 year old female presenting today for unwitnessed fall found down with laceration to her scalp.  6 cm laceration noted with no active bleeding at this time.  CT imaging of head and C-spine with no acute injuries.  Given that patient is nonverbal chest x-ray and pelvis x-ray were ordered which showed no acute pathology.  Patient otherwise at her baseline per family.  Laceration was debrided, clean, and 8 staples placed.  Patient's tetanus shot is up-to-date.  She is safe for discharge back to her memory care unit with follow-up with her primary care provider in 7 to 10 days for staple removal.  They were given strict return precautions.  The patient is on the cardiac monitor to evaluate for evidence of arrhythmia and/or significant heart rate changes. Clinical Course as of 09/17/22 1859  Wed Sep 17, 2022  1618 Independent interpreted CT head and cervical spine with no acute pathology [DW]  1630 Independently interpreted chest x-ray and pelvis x-ray with no acute pathology [DW]    Clinical Course User Index [DW] Janith Lima, MD     FINAL CLINICAL IMPRESSION(S) / ED DIAGNOSES   Final diagnoses:  Laceration of scalp, initial encounter  Injury of head,  initial encounter  Fall, initial encounter     Rx / DC Orders   ED Discharge Orders     None        Note:  This document was prepared using Dragon voice recognition software and may include unintentional dictation errors.   Janith Lima, MD 09/17/22 [phone]

## 2022-09-17 NOTE — ED Notes (Signed)
called to [facility] for pt transport to facility/[facility]/rep:operator 1777.

## 2023-07-07 DEATH — deceased
